# Patient Record
Sex: Male | Born: 1955 | Race: White | Hispanic: No | Marital: Married
Health system: Midwestern US, Community
[De-identification: ages and names within clinical notes are randomized; demographics above are authoritative.]

## PROBLEM LIST (undated history)

## (undated) DIAGNOSIS — M0579 Rheumatoid arthritis with rheumatoid factor of multiple sites without organ or systems involvement: Principal | ICD-10-CM

## (undated) DIAGNOSIS — M069 Rheumatoid arthritis, unspecified: Secondary | ICD-10-CM

## (undated) DIAGNOSIS — R5383 Other fatigue: Secondary | ICD-10-CM

## (undated) DIAGNOSIS — Z79631 Long term (current) use of antimetabolite agent: Secondary | ICD-10-CM

## (undated) DIAGNOSIS — D649 Anemia, unspecified: Secondary | ICD-10-CM

## (undated) DIAGNOSIS — R5381 Other malaise: Secondary | ICD-10-CM

---

## 2015-09-25 NOTE — Telephone Encounter (Addendum)
Call Type    VM   PRIORITY Normal   Initial Call Started: Richard Pitts,  Sep 19, 2015 10:22 AM   Call For: Richard Brunner MD   Time of Voicemail 10:10   Date of Voicemail 09/19/2015   VM Message:   needs standing lab orders renewed, he  uses Me. [facility], if any questions, patient can be reached at # [phone]      Initial call taken by: Richard Pitts,  Sep 19, 2015 10:22 AM         Follow-up for Phone Call    Follow-up Details: Standing  orders entered & will get MD hand signature; then I will fax.   Follow-up by: Annamary Carolin, LPN-II,  Sep 20, 5007 10:33 AM         New Orders:   CMP [[id number]]   CBC Reflex [[id number]]   ESR [[id number]]   C-Reactive Protein (CRP) [[id number]]         Electronically signed by Annamary Carolin, LPN-II on 69/67/8938 at 10:37 AM   Electronically signed by Richard Brunner MD on 09/20/2015 at 4:30 PM   ________________________________________________________________________

## 2015-10-02 NOTE — Telephone Encounter (Addendum)
Call Type    VM   PRIORITY Normal   Initial Call Started: Margarita RanaJani Shuman,  September 29, 2015 4:03 PM   Call For: Vangie BickerJalal Baer Hinton MD   Call back at Home Phone [phone]   Time of Voicemail 4:01   Date of Voicemail 09/29/2015   VM Message:   patient has done his labs done and would [facility], if any questions he can be reached at [phone]   Initial call taken by: Margarita RanaJani Shuman,  September 29, 2015 4:04 PM         Follow-up for Phone Call    Follow-up Details: labs stable on 09/27/15- refill  done      Follow-up by: Lyda PeroneElaine Hill, LPN-II,  September 29, 2015 4:14 PM   Prescriptions:   METHOTREXATE 2.5 MG TABS (METHOTREXATE SODIUM) Take eight (8) by mouth once weekly  #96[Unspecified] x 0       Entered by: Lyda PeroneElaine Hill, LPN-II       Authorized by:  Vangie BickerJalal Mohit Zirbes MD       Electronically signed by:   Lyda PeroneElaine Hill, LPN-II on 47/82/956206/12/2015       Method used:    Electronically to                [facility] (mail[facility]order)                              ,                  Ph:                Fax: [phone]       Rx:   [id number]:   [id number]                  Electronically signed by Lyda PeroneElaine Hill, LPN-II on 27/25/366406/12/2015 at 4:15 PM   Electronically signed by Vangie BickerJalal Renise Gillies MD on 10/02/2015 at 7:51 AM   ________________________________________________________________________

## 2016-04-05 NOTE — Progress Notes (Signed)
Visit Type:  Follow-up   Primary Provider:  Carollee Herter L. Lyda Jester FNP         History of Present Illness:    Richard Pitts returns for follow-up.      He is followed in rheumatology for seropositive rheumatoid arthritis.  He is on methotrexate 20 mg weekly, daily folic acid.      I have reviewed his chart since last visit, labs reviewed.  Interval history obtained.      Tyjae reports that he has done very well from rheumatoid arthritis perspective since last visit.  He denies any swollen or painful joints.  He reports some days he will have some stiffness in knuckles but most of the time he is symptom free.      He's been tolerating methotrexate well.  I reviewed his most recent blood work with him.  AST 12, ALT 34, creatinine 1.28, white cell count 7.7, hemoglobin 13, platelets 285.      Otherwise, patient denies any headaches, vision changes, rashes, photosensitivity, oral ulcers, genital ulcers, pleurisy, chest pain, cough, shortness of breath, abdominal pain, bloody stools, changes in bowel or urinary habits, new numbness tingling or weakness.   Review of other systems is negative except as mentioned above.      Social history and medications were reviewed.      RAPID 3 Scoring       Questions 11-13 have been found to be informative, but are not scored formally.       Over the last week were you able to:      Dress yourself, including tying shoelaces and doing buttons? 0   Get in and out of bed? 0   Lift a cup or glass to your mouth? 0   Walk outdoors on flat ground? 0   Wash and dry your entire body? 0   Bend down to pick up clothing from the floor? 0   Turn regular faucets on and off? 0   Get in and out of a car, bus, train or airplane? 0   Walk two miles or 3 kilometers, if you wish? 0   Participate in recreational activities and sports as you would like, if you wish? 0   Get a good night's sleep? 0   Deal with feelings of anxiety or being nervous? 0   Deal with feelings of depression or feeling blue?  0       Subtotal:  0      How much pain have you had because of your condition OVER THE PAST WEEK on a scale of 1-10 ?      0   Previous Pain Score:  0      Considering all the ways in which illness and health conditions may affect you at this time, please indicate below how you are doing (on a scale of 1-10):     0      Cumulative Score:  0   Weighed RAPID 3 Score 1   Previous RAPID3 Score:  1 (07/27/2015 4:24:41 PM)                  I reviewed and addressed the following rheumatology standards today:   VWU:[id number] L. Curtis FNP   Rapid 3:1 on 02/15/2016.   Pain Score:0 on 02/15/2016.      Rheumatology History:  Diagnosed rheumatoid arthritis based on positive rheumatoid factor, polyarthritis.   Negative anti-CCP antibody.   Infection screen: Negative hepatitis panel.   X-ray hands 2016: No erosions.  Normal x-rays.  S   3/16: Started on methotrexate, folic acid.   8/16:80% improvement with methotrexate, but still has some pain and achiness.  Want to give methotrexate full 6 months before adding hydroxychloroquine.   10/17: Rheumatoid arthritis in excellent remission.         Past Medical History:      Reviewed history from 07/11/2014 and no changes required:         see problem list      Past Surgical History:      Reviewed history from 07/11/2014 and no changes required:         unremarkable      Family History Summary:       Reviewed history Last on 07/27/2015 and no changes required:02/15/2016         General Comments - FH:   Mother: decd, kidney failure   Father: living, dementia   Sister: 2, living MS, lung    Brother: 1 alive and well   Maternal Grandmother: decd, age   Maternal Grandfather: decd, Cancer   Paternal Grandmother: decd, dementia and age arthritis   Paternal Grandfather:  decd, suicide   Children: 2 sons, and 1 daughter               Social History:      Reviewed history from 07/11/2014 and no changes required:         married         [employer]          Passive smoke exposure - no          Alcohol Use - yes         Drug Use - no         HIV High Risk Behavior - no         Patient does not get regular exercise.                   Smoking History:         Patient is a former smoker.         Depression Screening    Over the last 2 weeks has often been bothered by feeling down, depressed or hopeless:  Yes   Over the last 2 weeks has often been bothered by little interest or pleasure in doing things:  Yes   Depression Management Plan Required       Tobacco Use    Former smoker   Comments: quit 30 years ago      Do you use other tobacco products?  no   Have you been or are you currently exposed to second hand smoke?  no      For men, in the past year how often have you had 5 or more drinks a day? never   In the past year, how often have you used prescription drugs for non medical reasons? (example: getting high) never   In the past year, how often have you used illegal drugs?  never      Exercise    How intense is your typical exercise?  I am currently not exercising      Nutrition    How would you describe your eating habits? healthy   Do you have any concerns about what you eat? no I do not have concerns   Is there anything you would like to change? no   Caffeine use (drinks/day) 1/2 cup coffee  Review of Systems         See HPI         Vital Signs    Transition of Care:  Specialist   Patient Identity Verified with 2 Methods  Yes      Pain Assessment    Are you having significant pain today that you would like to discuss with the provider you are seeing today?no   BP Location:  Left arm   BP Cuff Size:  Regular   Method:  Automatic      Final BP: 125 / 73   Blood Pressure: 125/73 mm Hg   Pulse #1 53   Height: 75 inches    Entered Weight 237.2 lb Calculated Weight: 237.20 lb.  107.82kg   Body Mass Index: 29.76      Body Surface Area (m2): 2.36      Documented By: Cathleen Fears CMA,AAMA (February 15, 2016 4:15 PM)      Medications were reviewed and reconciled with the patient during this  visit including over the counter medications.       Allergies were reviewed with the patient during this visit.         Active Problems:     Renal insufficiency (ICD-593.9) (ICD10-N28.9)   MALAISE AND FATIGUE (ICD-780.79) (ICD10-R53.81)   Long-term (current) use of other medications (ICD-V58.69) (ICD10-Z79.899)   RHEUMATOID ARTHRITIS (ICD-714.0) (ICD10-M06.9)   Afib (ICD-427.31) (ICD10-I48.91)   Hypothyroidism (ICD-244.9) (ICD10-E03.9)      Active Medications:    METHOTREXATE 2.5 MG TABS (METHOTREXATE SODIUM) Take eight (8) by mouth once weekly   FOLIC ACID 1 MG TABS (FOLIC ACID) Take one (1) by mouth daily   VITAMIN D 1000 UNIT TABS (CHOLECALCIFEROL) 2 every day by mouth   ASPIRIN 325 MG TABS (ASPIRIN) 1 every day by mouth   SYNTHROID 25 MCG TABS (LEVOTHYROXINE SODIUM) 1 every day by mouth --not sure of the dose   METOPROLOL TARTRATE 25 MG TABS (METOPROLOL TARTRATE) one twice a day by mouth               Physical Exam      General:       well developed, well nourished, in no acute distress   Eyes:       PERRLA/EOM intact; conjunctiva and sclera clear   Mouth:       no deformity or mucous membrane lesions   Lungs:       clear bilaterally to A & P   Heart:       regular rate and rhythm, S1, S2 without murmurs, rubs, gallops, or clicks   Abdomen:       No pain, distention, tenderness.   Bowel sounds present.   No organomegaly.      Msk:       No active synovitis, tenderness, effusion, limitation in range of motion of DIPs, PIPs, MCPs, wrists, elbows, knees, ankles, toes bilaterally.   Normal range of motion of bilateral hips and shoulders.      Neurologic:       No gross deficit.   Normal speech, gait, coordination               Impression & Recommendations:      Problem # 1:  RHEUMATOID ARTHRITIS (ICD-714.0) (ICD10-M06.9)   RA is in an excellent clinical remission.   Continue methotrexate 20 mg weekly, daily folic acid.   Orders:   Ofc Vst, Est Lvl 3 ([id number])  Problem # 2:  Long-term (current) use of other medications (ICD-V58.69) (ICD10-Z79.899)   He is tolerating methotrexate well.  Blood work is stable.  Okay to continue methotrexate with regular blood work every 8&12 weeks.      Follow-up with me in 6 months.   Orders:   Ofc Vst, Est Lvl 3 ([id number])            Patient Instructions:   1)  Please schedule a follow-up appointment in 6 months.      Prescriptions:   FOLIC ACID 1 MG TABS (FOLIC ACID) Take one (1) by mouth daily  #90[Tablet] x 3       Entered by: Meriel PicaMark Slauenwhite CMA,AAMA       Authorized by:  Vangie BickerJalal Abelina Ketron MD       Electronically signed by:   Cathleen FearsMark Slauenwhite [facility] Hospital[facility]MA,AAMA on 02/15/2016       Method used:    Electronically to                [facility] (mail[facility]order)                              ,                  Ph:                Fax: [phone]       Rx:   [id number]:   [id number]   METHOTREXATE 2.5 MG TABS (METHOTREXATE SODIUM) Take eight (8) by mouth once weekly  #96[Tablet] x 0       Entered by: Meriel PicaMark Slauenwhite CMA,AAMA       Authorized by:  Vangie BickerJalal Burrell Hodapp MD       Electronically signed by:   Cathleen FearsMark Slauenwhite [facility] Center[facility]MA,AAMA on 02/15/2016       Method used:    Electronically to                [facility]* (mail-order)                              ,                  Ph:                Fax: [phone]       Rx:   [id number]:   [id number]               Orders:   Added new Service order of Ofc Vst, Est Lvl 3 ([id number]) - Signed            I have either personally documented or reviewed the patients past medical, surgical, family and social history and review of systems if present in the note.          Electronically signed by Vangie BickerJalal Hesham Womac MD on 02/15/2016 at 4:38 PM   ________________________________________________________________________   faxed to PCP               Electronically signed by Merrilyn PumaAlison Davis on 02/16/2016 at 12:28 PM   ________________________________________________________________________

## 2016-07-16 ENCOUNTER — Telehealth

## 2016-07-16 NOTE — Telephone Encounter (Signed)
Richard Pitts,    There is medication refill request for methotrexate.  His last blood work was in October  He needs to do blood work before I refill methotrexate.  I have ordered a standing order.  Please let patient know about this.  Once labs are stable I can refill medications.  Thanks

## 2016-07-16 NOTE — Telephone Encounter (Signed)
I left patient a message about this and mailed him the lab orders. & that no refills until labs are done & stable. Lab orders printed to be mailed to him.  I also [facility] the lab [facility] [phone]

## 2016-08-07 ENCOUNTER — Encounter

## 2016-08-08 ENCOUNTER — Telehealth

## 2016-08-08 ENCOUNTER — Encounter

## 2016-08-08 MED ORDER — METHOTREXATE SODIUM 2.5 MG TAB
2.5 mg | ORAL_TABLET | ORAL | 1 refills | Status: DC
Start: 2016-08-08 — End: 2017-02-09

## 2016-08-08 NOTE — Telephone Encounter (Signed)
I called  & left him a message; from [facility]; [facility] had [facility]; I let him know that is where I will send that RX. And I will mail him lab orders for every 3 months.   lab orders entered in " Orders only encounter"

## 2016-08-08 NOTE — Telephone Encounter (Signed)
His blood work is stable.    We can refill methotrexate 20 mg weekly.    But I do not know his pharmacy.  [facility] is [facility].  But please give him a call and find out if we need to send it to a local pharmacy.    He should continue doing blood work every 3 months.  Thanks

## 2016-08-15 ENCOUNTER — Ambulatory Visit: Admit: 2016-08-15 | Discharge: 2016-08-15 | Payer: TRICARE (CHAMPUS) | Attending: Internal Medicine

## 2016-08-15 DIAGNOSIS — M0579 Rheumatoid arthritis with rheumatoid factor of multiple sites without organ or systems involvement: Secondary | ICD-10-CM

## 2016-08-15 NOTE — Progress Notes (Signed)
Rheumatology History and Physical    Subjective:      Richard Pitts returns.  He is followed in rheumatology for seropositive rheumatoid arthritis.  He is on methotrexate 20 mg weekly, daily folic acid.     I have reviewed his chart, lab results.  Interval history obtained since I last saw him.    Richard Pitts it reports that he has done well from arthritis perspective.  He reports that on cold and rainy days he will have some stiffness in his knuckles but mostly he does not have much in terms of pain or stiffness.    He is tolerating methotrexate well.  He denies any side effects from it.  His most recent blood work is stable with normal sed rate of 6.  White cell count 7.21, hemoglobin 13.6, platelets 236, AST 11, ALT 31, creatinine 1.21.        Review of Systems   Constitutional: Negative.    HENT: Negative.    Eyes: Negative.    Respiratory: Negative.    Cardiovascular: Negative.    Gastrointestinal: Negative.    Genitourinary: Negative.    Musculoskeletal: Negative.    Skin: Negative.    Neurological: Negative.    Endo/Heme/Allergies: Negative.    Psychiatric/Behavioral: Negative.        Disease History:  Rheumatology History:  Diagnosed rheumatoid arthritis based on positive rheumatoid factor, polyarthritis.   Negative anti-CCP antibody.   Infection screen: Negative hepatitis panel.   X-ray hands 2016: No erosions.  Normal x-rays.     3/16: Started on methotrexate, folic acid.   8/16:80% improvement with methotrexate, but still has some pain and achiness.  Want to give methotrexate full 6 months before adding hydroxychloroquine.   10/17: Rheumatoid arthritis in excellent remission.       Past Medical History:   Diagnosis Date   ??? Afib (HCC) 07/11/2014   ??? Hypothyroidism 07/11/2014   ??? Malaise and fatigue 07/11/2014   ??? Other long term (current) drug therapy 07/11/2014   ??? Renal insufficiency syndrome 07/13/2014   ??? Rheumatoid arthritis (HCC) 07/11/2014     History reviewed. No pertinent surgical history.  Family History    Problem Relation Age of Onset   ??? Kidney Disease Mother      KIDNEY FAILURE   ??? Dementia Father    ??? MS Sister    ??? Lung Disease Sister    ??? No Known Problems Brother    ??? Cancer Maternal Grandfather    ??? Dementia Paternal Grandmother    ??? Arthritis-osteo Paternal Grandmother    ??? MS Sister    ??? Lung Disease Sister    ??? No Known Problems Son    ??? No Known Problems Son    ??? No Known Problems Daughter      Social History   Substance Use Topics   ??? Smoking status: Former Smoker   ??? Smokeless tobacco: Never Used   ??? Alcohol use Yes     Allergies no known allergies  Prior to Admission medications    Medication Sig Start Date End Date Taking? Authorizing Provider   pedi multivit no.140-iron fum (CHILD MULTIVITAMIN PLUS IRON) 18 mg iron chew Take  by mouth.   Yes Historical Provider   cholecalciferol (VITAMIN D3) 1,000 unit tablet Take 2 Tabs by mouth daily. 07/11/14  Yes Historical Provider   aspirin (ASPIRIN) 325 mg tablet Take 1 Tab by mouth daily. 07/11/14  Yes Historical Provider   folic acid (FOLVITE) 1 mg tablet  Take 1 Tab by mouth daily. 07/12/14  Yes Historical Provider   metoprolol tartrate (LOPRESSOR) 25 mg tablet Take 1 Tab by mouth two (2) times a day. 07/11/14  Yes Historical Provider   levothyroxine (SYNTHROID) 25 mcg tablet Take 1 Tab by mouth daily. 07/11/14  Yes Historical Provider   methotrexate (RHEUMATREX) 2.5 mg tablet Take 8 Tabs by mouth Every Friday. 08/09/16  Yes Vangie Bicker, MD   aspirin (ASPIRIN) 325 mg tablet Take 325 mg by mouth daily.   Yes Historical Provider              Lab Review:  Reviewed per HPI    Vitals:    08/15/16 1630   BP: 135/68   Pulse: (!) 51   Weight: 234 lb (106.1 kg)   Height:  (1.905 m)       Physical Exam   Constitutional: He is oriented to person, place, and time and well-developed, well-nourished, and in no distress.   HENT:   Head: Normocephalic and atraumatic.   Right Ear: External ear normal.   Left Ear: External ear normal.    Eyes: Conjunctivae and EOM are normal. Pupils are equal, round, and reactive to light.   Neck: Normal range of motion. Neck supple. No thyromegaly present.   Cardiovascular: Normal rate, regular rhythm and normal heart sounds.    Pulmonary/Chest: Effort normal and breath sounds normal. He has no wheezes. He has no rales.   Abdominal: Soft. Bowel sounds are normal. He exhibits no distension and no mass. There is no tenderness.   Musculoskeletal:   No tenderness, effusion, warmth, redness, synovitis in the DIPs, PIPs, MCPs, wrists elbows, knees, ankles and toes bilaterally.    Normal range of motion of both shoulders and hips.     Neurological: He is alert and oriented to person, place, and time.   Skin: Skin is warm and dry.   Psychiatric: Affect and judgment normal.       Plan:     Encounter Diagnoses     ICD-10-CM ICD-9-CM   1. Rheumatoid arthritis involving multiple sites with positive rheumatoid factor (HCC)  Rheumatoid arthritis is well controlled.  Continue 20 mg weekly methotrexate, daily folic acid. M05.79 714.0   2. Long term methotrexate user  He is tolerating methotrexate well.  Most recent blood work is stable.  Okay to continue methotrexate with regular blood work every 12 weeks. Z61.096 V58.69       Signed By:  Vangie Bicker, MD     August 15, 2016

## 2016-08-15 NOTE — Patient Instructions (Signed)
Rheumatoid arthritis is well controlled.  Please continue current dosages of methotrexate and folic acid.  Blood work every 3 months.  Follow up with me in 6 months.

## 2016-08-28 NOTE — Telephone Encounter (Signed)
Pt called in regards to the letter he got from this office stating he is missing lab work.  He got [facility] done and [facility] and that his PCP: Dr. Tonita PhoenixNightingale also had the results

## 2016-08-29 NOTE — Telephone Encounter (Signed)
I will put on providers desk to review

## 2016-08-29 NOTE — Telephone Encounter (Signed)
They are faxing over lab results from April

## 2016-09-19 ENCOUNTER — Encounter

## 2016-11-25 ENCOUNTER — Encounter

## 2016-12-04 ENCOUNTER — Encounter

## 2016-12-06 ENCOUNTER — Encounter

## 2017-02-09 ENCOUNTER — Encounter

## 2017-02-12 MED ORDER — METHOTREXATE SODIUM 2.5 MG TAB
2.5 mg | ORAL_TABLET | ORAL | 1 refills | Status: DC
Start: 2017-02-12 — End: 2017-04-11

## 2017-02-18 ENCOUNTER — Encounter: Attending: Internal Medicine

## 2017-02-20 ENCOUNTER — Ambulatory Visit: Admit: 2017-02-20 | Discharge: 2017-02-20 | Payer: TRICARE (CHAMPUS) | Attending: Internal Medicine

## 2017-02-20 DIAGNOSIS — M0579 Rheumatoid arthritis with rheumatoid factor of multiple sites without organ or systems involvement: Secondary | ICD-10-CM

## 2017-02-20 NOTE — Progress Notes (Signed)
Rheumatology History and Physical    Subjective:      Richard Pitts   He is followed in rheumatology for seropositive rheumatoid arthritis. ??He is on methotrexate 20 mg weekly, daily folic acid.     I have reviewed his chart, lab results.  Interval history obtained since I last saw him.    Richard GristDave it reports that he is doing very good.  He does not report any pain, stiffness.  He is tolerating methotrexate well.  I reviewed his most recent blood work from this morning.  It is stable with sed rate of 8, creatinine 1.1, AST 14, ALT 46, white cell count 6, hemoglobin 14, platelets 253.    Review of Systems   Constitutional: Negative.    HENT: Negative.    Eyes: Negative.    Respiratory: Negative.    Cardiovascular: Negative.    Gastrointestinal: Negative.    Genitourinary: Negative.    Musculoskeletal: Negative.    Skin: Negative.    Neurological: Negative.    Endo/Heme/Allergies: Negative.    Psychiatric/Behavioral: Negative.        Disease History:  Rheumatology History: ??Diagnosed rheumatoid arthritis based on positive rheumatoid factor, polyarthritis.   Negative anti-CCP antibody.   Infection screen: Negative hepatitis panel.   X-ray hands 2016: No erosions. ??Normal x-rays. ??   3/16: Started on methotrexate, folic acid.   8/16:80% improvement with methotrexate, but still has some pain and achiness. ??Want to give methotrexate full 6 months before adding hydroxychloroquine.   10/17: Rheumatoid arthritis in excellent remission.           Past Medical History:   Diagnosis Date   ??? Afib (HCC) 07/11/2014   ??? Hypothyroidism 07/11/2014   ??? Malaise and fatigue 07/11/2014   ??? Other long term (current) drug therapy 07/11/2014   ??? Renal insufficiency syndrome 07/13/2014   ??? Rheumatoid arthritis (HCC) 07/11/2014     History reviewed. No pertinent surgical history.  Family History   Problem Relation Age of Onset   ??? Kidney Disease Mother         KIDNEY FAILURE   ??? Dementia Father    ??? MS Sister    ??? Lung Disease Sister     ??? No Known Problems Brother    ??? Cancer Maternal Grandfather    ??? Dementia Paternal Grandmother    ??? Arthritis-osteo Paternal Grandmother    ??? MS Sister    ??? Lung Disease Sister    ??? No Known Problems Son    ??? No Known Problems Son    ??? No Known Problems Daughter      Social History     Tobacco Use   ??? Smoking status: Former Smoker   ??? Smokeless tobacco: Never Used   Substance Use Topics   ??? Alcohol use: Yes     No Known Allergies  Prior to Admission medications    Medication Sig Start Date End Date Taking? Authorizing Provider   levothyroxine (SYNTHROID) 75 mcg tablet  11/21/16  Yes Provider, Historical   methotrexate (RHEUMATREX) 2.5 mg tablet TAKE 8 TABLETS EVERY FRIDAY 02/12/17  Yes Anjel Perfetti, MD   pedi multivit no.140-iron fum (CHILD MULTIVITAMIN PLUS IRON) 18 mg iron chew Take  by mouth.   Yes Provider, Historical   cholecalciferol (VITAMIN D3) 1,000 unit tablet Take 2 Tabs by mouth daily. 07/11/14  Yes Provider, Historical   folic acid (FOLVITE) 1 mg tablet Take 1 Tab by mouth daily. 07/12/14  Yes Provider, Historical  metoprolol tartrate (LOPRESSOR) 25 mg tablet Take 1 Tab by mouth two (2) times a day. 07/11/14  Yes Provider, Historical   aspirin (ASPIRIN) 325 mg tablet Take 325 mg by mouth daily.   Yes Provider, Historical   cyclobenzaprine (FLEXERIL) 5 mg tablet  01/31/17   Provider, Historical              Lab Review:  Reviewed per HPI    Vitals:    02/20/17 1554   BP: 124/78   Pulse: (!) 49   Weight: 231 lb (104.8 kg)       Physical Exam   Constitutional: He is oriented to person, place, and time and well-developed, well-nourished, and in no distress.   HENT:   Head: Normocephalic and atraumatic.   Right Ear: External ear normal.   Left Ear: External ear normal.   Eyes: Conjunctivae and EOM are normal. Pupils are equal, round, and reactive to light.   Neck: Normal range of motion. Neck supple. No thyromegaly present.   Cardiovascular: Normal rate, regular rhythm and normal heart sounds.    Pulmonary/Chest: Effort normal and breath sounds normal. He has no wheezes. He has no rales.   Abdominal: Soft. Bowel sounds are normal. He exhibits no distension and no mass. There is no tenderness.   Musculoskeletal:   No tenderness, effusion, warmth, redness, synovitis in the DIPs, PIPs, MCPs, wrists elbows, knees, ankles and toes bilaterally.    Normal range of motion of both shoulders and hips.     Neurological: He is alert and oriented to person, place, and time.   Skin: Skin is warm and dry.   Psychiatric: Affect and judgment normal.       Plan:     Encounter Diagnoses     ICD-10-CM ICD-9-CM   1. Rheumatoid arthritis involving multiple sites with positive rheumatoid factor (HCC)  Rheumatoid arthritis is well controlled.  Continue methotrexate 20 mg weekly, daily folic acid. M05.79 714.0   2. Long term methotrexate user  Most recent blood work reviewed with him.  It is stable.  He will continue doing blood work every 12 weeks to monitor for cytopenias, transaminitis.    Follow up with me in 1 year.  Earlier if needed. Z61.096 V58.69       Signed By:  Vangie Bicker, MD     February 20, 2017

## 2017-02-20 NOTE — Patient Instructions (Signed)
Follow-up in 1 year.

## 2017-04-10 MED ORDER — FOLIC ACID 1 MG TAB
1 mg | ORAL_TABLET | ORAL | 3 refills | Status: DC
Start: 2017-04-10 — End: 2018-03-13

## 2017-04-11 ENCOUNTER — Encounter

## 2017-04-11 MED ORDER — METHOTREXATE SODIUM 2.5 MG TAB
2.5 mg | ORAL_TABLET | ORAL | 1 refills | Status: DC
Start: 2017-04-11 — End: 2017-07-15

## 2017-04-17 ENCOUNTER — Telehealth

## 2017-04-17 NOTE — Telephone Encounter (Addendum)
Ok to refill methotrexate.  He e-mail me for that.  Up-to-date on labs.   [facility]    Please ask him to make sure we send it to the correct 1.    Also let him know that he needs to do hepatitis-B core antibody.     Please mail him the orders.

## 2017-04-17 NOTE — Telephone Encounter (Signed)
Regarding: FW: Prescription Question  Contact: [phone]      ----- Message -----  From: Richard Pitts  Sent: 04/11/2017   3:14 PM  To: Cipriano BunkerBrh Ma  Subject: Prescription Question                            ----- Message from Mychart, Generic sent at 04/11/2017  3:14 PM EST -----    Dr. Rica MastMukhtar,    I just submitted a refill request for methotrexate. It might be early, but want to make sure it is processed and I receive it before January 24th because my wife and I are headed to [place] again for three months on January 25th. Just want to make sure I have enough to cover the next 4 months.    Thanks and Happy Holidays to you, your staff, and especially your family.    Richard Pitts

## 2017-04-17 NOTE — Telephone Encounter (Signed)
Regarding: FW: Prescription Question  Contact: [phone]      ----- Message -----  From: Richard Pitts  Sent: 04/11/2017   3:14 PM  To: Brh Ma  Subject: Prescription Question                            ----- Message from Mychart, Generic sent at 04/11/2017  3:14 PM EST -----    Dr. Maricela Schreur,    I just submitted a refill request for methotrexate. It might be early, but want to make sure it is processed and I receive it before January 24th because my wife and I are headed to [place] again for three months on January 25th. Just want to make sure I have enough to cover the next 4 months.    Thanks and Happy Holidays to you, your staff, and especially your family.    Richard Pitts

## 2017-04-18 NOTE — Telephone Encounter (Signed)
Order mailed

## 2017-07-15 ENCOUNTER — Encounter

## 2017-07-17 MED ORDER — METHOTREXATE SODIUM 2.5 MG TAB
2.5 mg | ORAL_TABLET | ORAL | 1 refills | Status: DC
Start: 2017-07-17 — End: 2018-01-14

## 2017-07-18 ENCOUNTER — Encounter

## 2017-11-20 ENCOUNTER — Encounter

## 2018-01-14 ENCOUNTER — Encounter

## 2018-01-14 MED ORDER — METHOTREXATE SODIUM 2.5 MG TAB
2.5 mg | ORAL_TABLET | ORAL | 1 refills | Status: DC
Start: 2018-01-14 — End: 2018-02-17

## 2018-01-14 NOTE — Telephone Encounter (Signed)
Follow up 02/24/18  Last labs 01/02/18    90 day rx with 1 refill sent.

## 2018-02-17 ENCOUNTER — Ambulatory Visit

## 2018-02-17 ENCOUNTER — Ambulatory Visit: Admit: 2018-02-17 | Discharge: 2018-02-17 | Payer: TRICARE (CHAMPUS) | Attending: Internal Medicine

## 2018-02-17 DIAGNOSIS — M0579 Rheumatoid arthritis with rheumatoid factor of multiple sites without organ or systems involvement: Secondary | ICD-10-CM

## 2018-02-17 MED ORDER — METHOTREXATE SODIUM 2.5 MG TAB
2.5 mg | ORAL_TABLET | ORAL | 1 refills | Status: DC
Start: 2018-02-17 — End: 2018-11-18

## 2018-02-17 NOTE — Progress Notes (Signed)
Rheumatology History and Physical    Subjective:      Richard Pitts returns.  He is followed in rheumatology for seropositive rheumatoid arthritis. ??He is on methotrexate 20 mg weekly, daily folic acid.??    I have reviewed his chart, interval history obtained.  Labs reviewed.    Richard Pitts reports that he is doing great.  He reports he does not have any painful, swollen joints.  No stiffness.  He is tolerating methotrexate well.  Most recent blood work from September shows normal sed rate and CRP of 13, 0.3 respectively.  White cell count 6.5, hemoglobin 14, platelets 261, AST 9, ALT 26, creatinine 1.22, AST 9, ALT 26.    He reports he recently had an elevated PSA.  He is going to [place] but he has discussed with his doctors.  He will do biopsy after he returns.  Today he denies any urinary symptoms.        Review of Systems   Constitutional: Negative.    HENT: Negative.    Eyes: Negative.    Respiratory: Negative.    Cardiovascular: Negative.    Gastrointestinal: Negative.    Genitourinary: Negative.    Musculoskeletal: Negative.    Skin: Negative.    Neurological: Negative.    Endo/Heme/Allergies: Negative.    Psychiatric/Behavioral: Negative.        Disease History:  Rheumatology History: ??Diagnosed rheumatoid arthritis based on positive rheumatoid factor, polyarthritis.   Negative anti-CCP antibody.   Infection screen: Negative chronic B/C hepatitis panel.   X-ray hands 2016: No erosions. ??Normal x-rays. ????  3/16: Started on methotrexate, folic acid.   8/16:80% improvement with methotrexate, but still has some pain and achiness. ??Want to give methotrexate full 6 months before adding hydroxychloroquine.   10/17: Rheumatoid arthritis in excellent remission.??  ??      Past Medical History:   Diagnosis Date   ??? Afib (HCC) 07/11/2014   ??? Hypothyroidism 07/11/2014   ??? Malaise and fatigue 07/11/2014   ??? Other long term (current) drug therapy 07/11/2014   ??? Renal insufficiency syndrome 07/13/2014    ??? Rheumatoid arthritis (HCC) 07/11/2014     History reviewed. No pertinent surgical history.  Family History   Problem Relation Age of Onset   ??? Kidney Disease Mother         KIDNEY FAILURE   ??? Dementia Father    ??? MS Sister    ??? Lung Disease Sister    ??? No Known Problems Brother    ??? Cancer Maternal Grandfather    ??? Dementia Paternal Grandmother    ??? Arthritis-osteo Paternal Grandmother    ??? MS Sister    ??? Lung Disease Sister    ??? No Known Problems Son    ??? No Known Problems Son    ??? No Known Problems Daughter      Social History     Tobacco Use   ??? Smoking status: Former Smoker   ??? Smokeless tobacco: Never Used   Substance Use Topics   ??? Alcohol use: Yes     No Known Allergies  Prior to Admission medications    Medication Sig Start Date End Date Taking? Authorizing Provider   methotrexate (RHEUMATREX) 2.5 mg tablet Take 8 Tabs by mouth Every Friday. 02/20/18  Yes Aarini Slee, Bevelyn Ngo, MD   folic acid (FOLVITE) 1 mg tablet TAKE 1 TABLET DAILY 04/10/17   Vangie Bicker, MD   cyclobenzaprine (FLEXERIL) 5 mg tablet  01/31/17   Provider, Historical  levothyroxine (SYNTHROID) 75 mcg tablet  11/21/16   Provider, Historical   pedi multivit no.140-iron fum (CHILD MULTIVITAMIN PLUS IRON) 18 mg iron chew Take  by mouth.    Provider, Historical   cholecalciferol (VITAMIN D3) 1,000 unit tablet Take 2 Tabs by mouth daily. 07/11/14   Provider, Historical   metoprolol tartrate (LOPRESSOR) 25 mg tablet Take 1 Tab by mouth two (2) times a day. 07/11/14   Provider, Historical   aspirin (ASPIRIN) 325 mg tablet Take 325 mg by mouth daily.    Provider, Historical              Lab Review:  Reviewed per HPI    Vitals:    02/17/18 1058   BP: 144/79   Pulse: (!) 49   Weight: 234 lb (106.1 kg)   Height: 6\' 3"  (1.905 m)       Physical Exam   Constitutional: He is oriented to person, place, and time and well-developed, well-nourished, and in no distress.   HENT:   Head: Normocephalic and atraumatic.   Right Ear: External ear normal.    Left Ear: External ear normal.   Eyes: Pupils are equal, round, and reactive to light. Conjunctivae and EOM are normal.   Neck: Normal range of motion. Neck supple. No thyromegaly present.   Cardiovascular: Normal rate, regular rhythm and normal heart sounds.   Pulmonary/Chest: Effort normal and breath sounds normal. He has no wheezes. He has no rales.   Abdominal: Soft. Bowel sounds are normal. He exhibits no distension and no mass. There is no tenderness.   Musculoskeletal:   No tenderness, effusion, warmth, redness, synovitis in the DIPs, PIPs, MCPs, wrists elbows, knees, ankles and toes bilaterally.    Normal range of motion of both shoulders and hips.     Neurological: He is alert and oriented to person, place, and time.   Skin: Skin is warm and dry.   Psychiatric: Affect and judgment normal.       Plan:     Encounter Diagnoses     ICD-10-CM ICD-9-CM   1. Rheumatoid arthritis involving multiple sites with positive rheumatoid factor (HCC) M05.79 714.0   2. Long term methotrexate user Z79.899 V58.69   3. Rheumatoid arthritis, involving unspecified site, unspecified rheumatoid factor presence (HCC) M06.9 714.0    Rheumatoid arthritis is in remission.  Continue methotrexate 20 mg weekly, daily folic acid.  He will continue doing blood work every 12 weeks to monitor for cytopenias and transaminitis.    Follow up in 1 year.    Signed By:  Vangie Bicker, MD     February 17, 2018

## 2018-02-17 NOTE — Progress Notes (Signed)
Rheumatology History and Physical    Subjective:      Richard Pitts returns.  He is followed in rheumatology for seropositive rheumatoid arthritis. ??He is on methotrexate 20 mg weekly, daily folic acid.??    I have reviewed his chart, interval history obtained.  Labs reviewed.    Richard Pitts reports that he is doing great.  He reports he does not have any painful, swollen joints.  No stiffness.  He is tolerating methotrexate well.  Most recent blood work from September shows normal sed rate and CRP of 13, 0.3 respectively.  White cell count 6.5, hemoglobin 14, platelets 261, AST 9, ALT 26, creatinine 1.22, AST 9, ALT 26.    He reports he recently had an elevated PSA.  He is going to [place] but he has discussed with his doctors.  He will do biopsy after he returns.  Today he denies any urinary symptoms.        Review of Systems   Constitutional: Negative.    HENT: Negative.    Eyes: Negative.    Respiratory: Negative.    Cardiovascular: Negative.    Gastrointestinal: Negative.    Genitourinary: Negative.    Musculoskeletal: Negative.    Skin: Negative.    Neurological: Negative.    Endo/Heme/Allergies: Negative.    Psychiatric/Behavioral: Negative.        Disease History:  Rheumatology History: ??Diagnosed rheumatoid arthritis based on positive rheumatoid factor, polyarthritis.   Negative anti-CCP antibody.   Infection screen: Negative chronic B/C hepatitis panel.   X-ray hands 2016: No erosions. ??Normal x-rays. ????  3/16: Started on methotrexate, folic acid.   8/16:80% improvement with methotrexate, but still has some pain and achiness. ??Want to give methotrexate full 6 months before adding hydroxychloroquine.   10/17: Rheumatoid arthritis in excellent remission.??  ??      Past Medical History:   Diagnosis Date   ??? Afib (HCC) 07/11/2014   ??? Hypothyroidism 07/11/2014   ??? Malaise and fatigue 07/11/2014   ??? Other long term (current) drug therapy 07/11/2014   ??? Renal insufficiency syndrome 07/13/2014   ??? Rheumatoid arthritis (HCC)  07/11/2014     History reviewed. No pertinent surgical history.  Family History   Problem Relation Age of Onset   ??? Kidney Disease Mother         KIDNEY FAILURE   ??? Dementia Father    ??? MS Sister    ??? Lung Disease Sister    ??? No Known Problems Brother    ??? Cancer Maternal Grandfather    ??? Dementia Paternal Grandmother    ??? Arthritis-osteo Paternal Grandmother    ??? MS Sister    ??? Lung Disease Sister    ??? No Known Problems Son    ??? No Known Problems Son    ??? No Known Problems Daughter      Social History     Tobacco Use   ??? Smoking status: Former Smoker   ??? Smokeless tobacco: Never Used   Substance Use Topics   ??? Alcohol use: Yes     No Known Allergies  Prior to Admission medications    Medication Sig Start Date End Date Taking? Authorizing Provider   methotrexate (RHEUMATREX) 2.5 mg tablet Take 8 Tabs by mouth Every Friday. 02/20/18  Yes Alvis Pulcini, Bevelyn Ngo, MD   folic acid (FOLVITE) 1 mg tablet TAKE 1 TABLET DAILY 04/10/17   Vangie Bicker, MD   cyclobenzaprine (FLEXERIL) 5 mg tablet  01/31/17   Provider, Historical  levothyroxine (SYNTHROID) 75 mcg tablet  11/21/16   Provider, Historical   pedi multivit no.140-iron fum (CHILD MULTIVITAMIN PLUS IRON) 18 mg iron chew Take  by mouth.    Provider, Historical   cholecalciferol (VITAMIN D3) 1,000 unit tablet Take 2 Tabs by mouth daily. 07/11/14   Provider, Historical   metoprolol tartrate (LOPRESSOR) 25 mg tablet Take 1 Tab by mouth two (2) times a day. 07/11/14   Provider, Historical   aspirin (ASPIRIN) 325 mg tablet Take 325 mg by mouth daily.    Provider, Historical              Lab Review:  Reviewed per HPI    Vitals:    02/17/18 1058   BP: 144/79   Pulse: (!) 49   Weight: 234 lb (106.1 kg)   Height: 6\' 3"  (1.905 m)       Physical Exam   Constitutional: He is oriented to person, place, and time and well-developed, well-nourished, and in no distress.   HENT:   Head: Normocephalic and atraumatic.   Right Ear: External ear normal.   Left Ear: External ear normal.   Eyes: Pupils  are equal, round, and reactive to light. Conjunctivae and EOM are normal.   Neck: Normal range of motion. Neck supple. No thyromegaly present.   Cardiovascular: Normal rate, regular rhythm and normal heart sounds.   Pulmonary/Chest: Effort normal and breath sounds normal. He has no wheezes. He has no rales.   Abdominal: Soft. Bowel sounds are normal. He exhibits no distension and no mass. There is no tenderness.   Musculoskeletal:   No tenderness, effusion, warmth, redness, synovitis in the DIPs, PIPs, MCPs, wrists elbows, knees, ankles and toes bilaterally.    Normal range of motion of both shoulders and hips.     Neurological: He is alert and oriented to person, place, and time.   Skin: Skin is warm and dry.   Psychiatric: Affect and judgment normal.       Plan:     Encounter Diagnoses     ICD-10-CM ICD-9-CM   1. Rheumatoid arthritis involving multiple sites with positive rheumatoid factor (HCC) M05.79 714.0   2. Long term methotrexate user Z79.899 V58.69   3. Rheumatoid arthritis, involving unspecified site, unspecified rheumatoid factor presence (HCC) M06.9 714.0    Rheumatoid arthritis is in remission.  Continue methotrexate 20 mg weekly, daily folic acid.  He will continue doing blood work every 12 weeks to monitor for cytopenias and transaminitis.    Follow up in 1 year.    Signed By:  Vangie Bicker, MD     February 17, 2018

## 2018-02-24 ENCOUNTER — Encounter: Attending: Internal Medicine

## 2018-03-13 MED ORDER — FOLIC ACID 1 MG TAB
1 mg | ORAL_TABLET | ORAL | 4 refills | Status: AC
Start: 2018-03-13 — End: ?

## 2018-03-24 NOTE — Telephone Encounter (Signed)
His most recent blood work shows slightly high creatinine of 1.33 with GFR of 54.  He previously had slightly high creatinine as well and then it normalized.    Please give him a call and make sure that he avoids NSAIDs like ibuprofen, naproxen/Aleve etc.  Tylenol will be okay.  Okay to continue methotrexate with blood work every 12 weeks.  Thanks

## 2018-03-24 NOTE — Telephone Encounter (Signed)
Pt is aware

## 2018-06-17 NOTE — Telephone Encounter (Signed)
Pt left vm asking to transfer care to [facility] in [place]. He would like his records sent there. Left vm for pt explaining he needs to sign a ROI to to release records.

## 2018-06-25 NOTE — Telephone Encounter (Signed)
Lab called from [facility] and they need a new standing order sent to the lab.  Patient had a draw today.      [place] [phone] lab  Fax [phone]

## 2018-06-26 ENCOUNTER — Encounter

## 2018-06-26 NOTE — Telephone Encounter (Signed)
Richard Pitts has faxed the standing order to the lab.  I

## 2018-10-08 ENCOUNTER — Encounter

## 2018-11-18 ENCOUNTER — Telehealth

## 2018-11-18 MED ORDER — METHOTREXATE SODIUM 2.5 MG TAB
2.5 mg | ORAL_TABLET | ORAL | 1 refills | Status: AC
Start: 2018-11-18 — End: ?

## 2018-11-18 NOTE — Telephone Encounter (Signed)
Pt lvm for rx refill    Medication- Methotrexate   Dose- 2.5 mg   How many per day- 8 tabs every Friday    [facility]  How many left- pt is completely out

## 2018-11-18 NOTE — Telephone Encounter (Signed)
I have sent in rx

## 2019-02-18 ENCOUNTER — Encounter: Attending: Internal Medicine

## 2019-04-07 ENCOUNTER — Ambulatory Visit: Payer: TRICARE (CHAMPUS) | Attending: Internal Medicine

## 2019-04-07 NOTE — Telephone Encounter (Signed)
I called Pt to see why they missed there appointment but only got a voice machine. FYI

## 2019-04-08 NOTE — Telephone Encounter (Signed)
PT called left a vm he wanted to know how he missed an appointment. He is going to a new provider and office.

## 2019-04-09 NOTE — Telephone Encounter (Signed)
Kym so he will no longer be seeing [place]?

## 2019-04-09 NOTE — Telephone Encounter (Signed)
Noted  

## 2020-06-27 IMAGING — DX THORACIC SPINE 2 VIEWS
1 series · 2 of 2 positions shown · non-contrast
Comparison: None.

THORACIC SPINE 2 VIEWS, 06/27/2020 [DATE]: 
CLINICAL INDICATION:  Back pain

[Series 1: AP · U · 0.14mm/px · 2 of 2 slices shown]
[im 1/2]
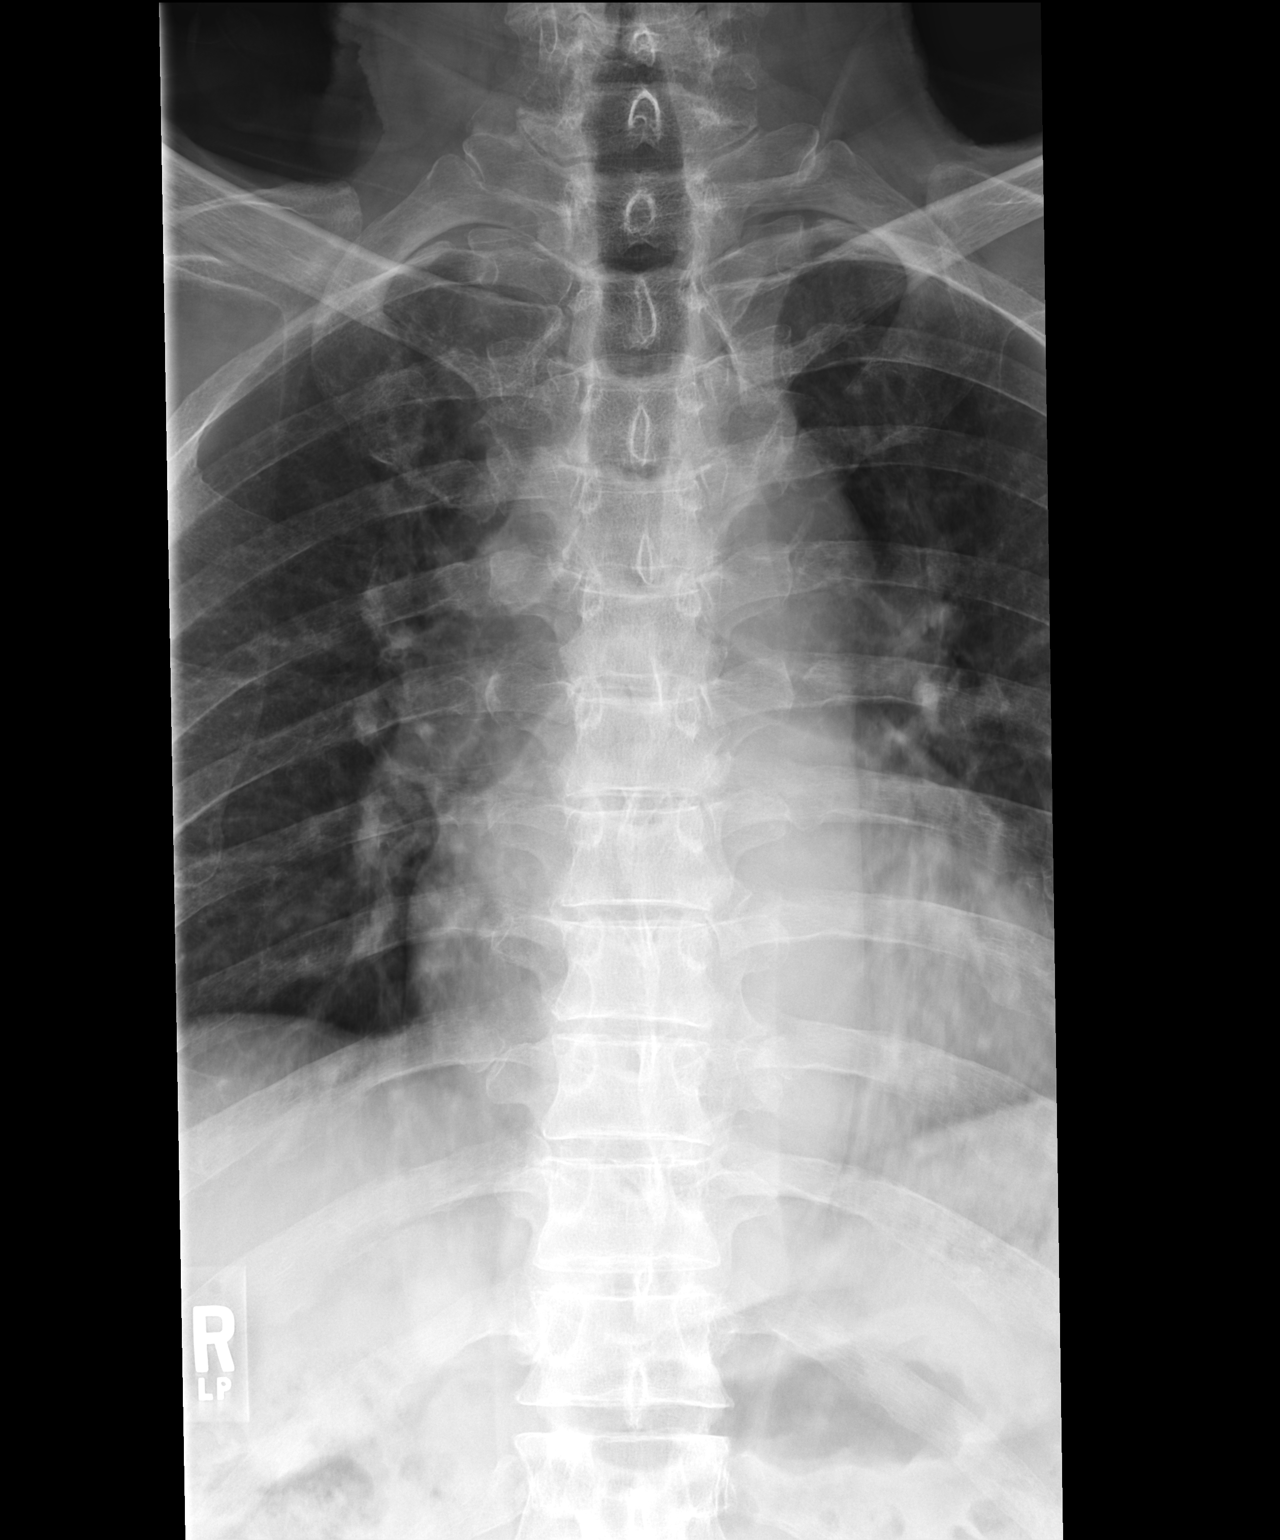
[im 2/2]
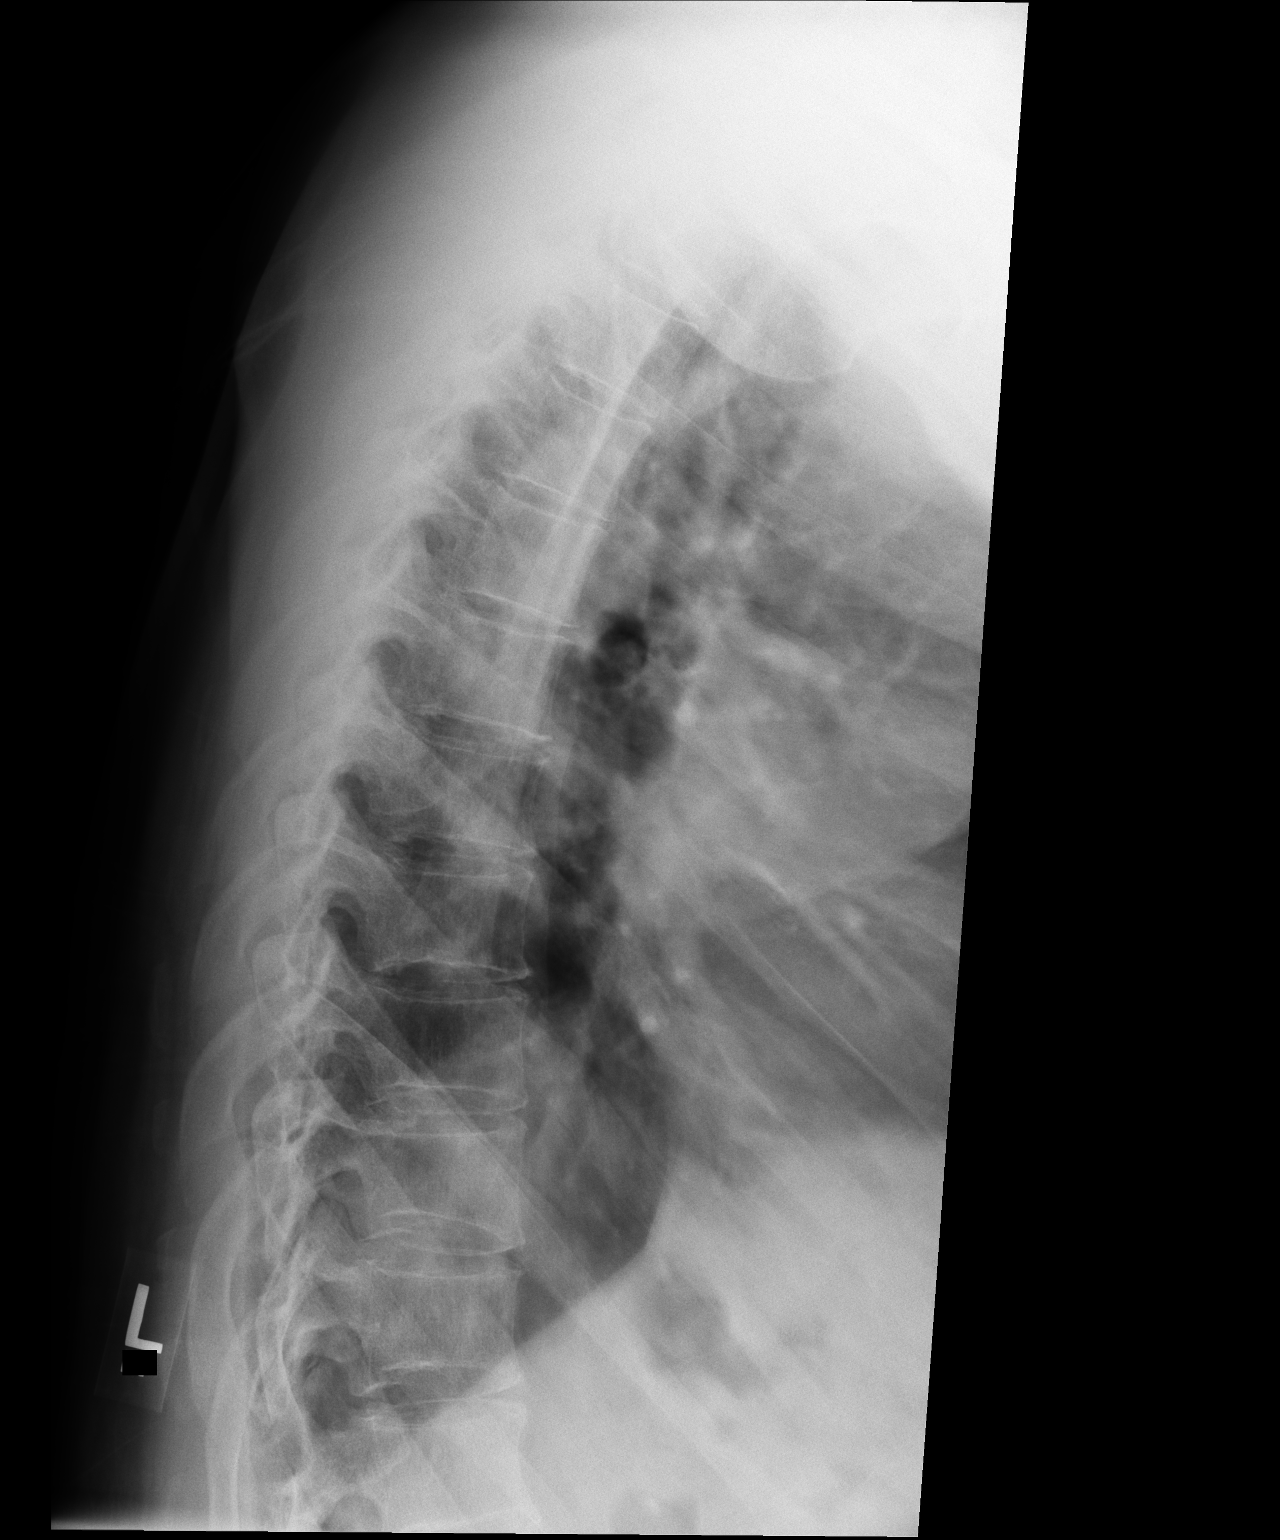

[2 of 2 positions shown; findings below may reference images not displayed]

FINDINGS: No fracture. Normal alignment. Joint spaces are preserved. No focal 
soft tissue swelling.
IMPRESSION: Negative exam.

## 2020-06-27 IMAGING — DX LUMBAR SPINE COMPLETE 4 VIEWS
1 series · 5 of 5 positions shown · non-contrast
Comparison: None prior of the lumbar spine.

LUMBAR SPINE COMPLETE 4 VIEWS, 06/27/2020 [DATE]: 
CLINICAL INDICATION:  Posterior neck pain. Mid and low back pain. Right knee 
numbness.

[Series 1: AP · U · 0.14mm/px · 5 of 5 slices shown]
[im 1/5]
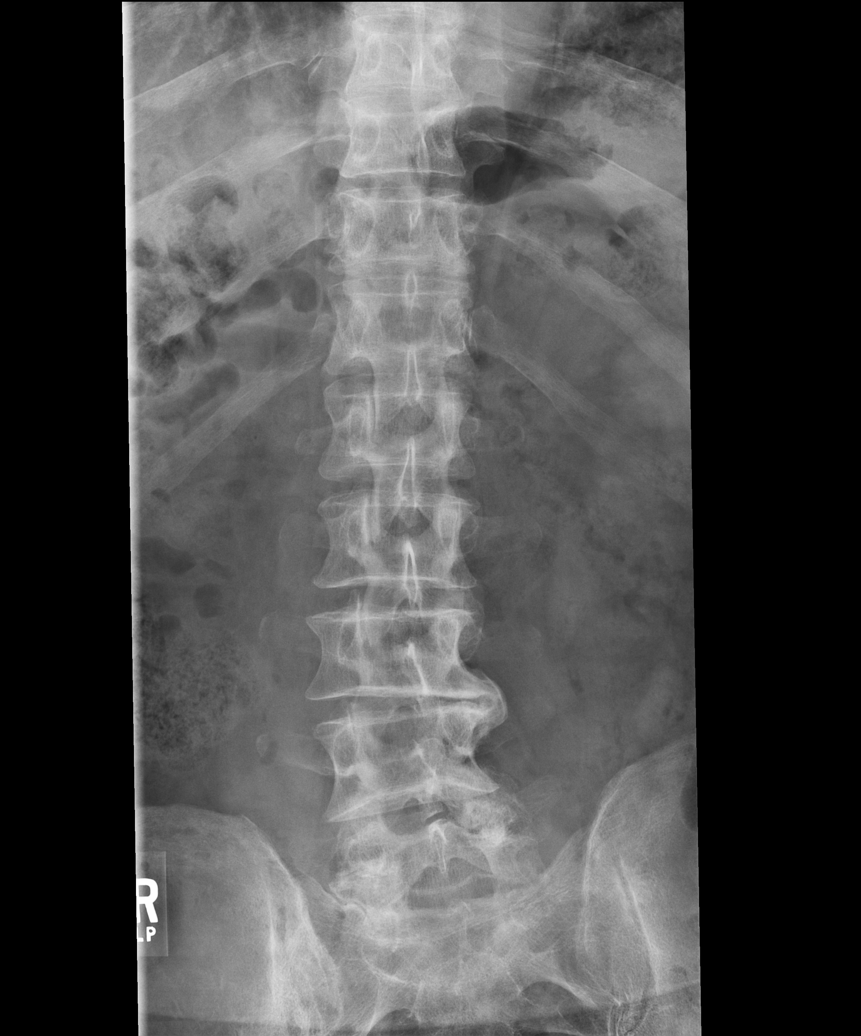
[im 2/5]
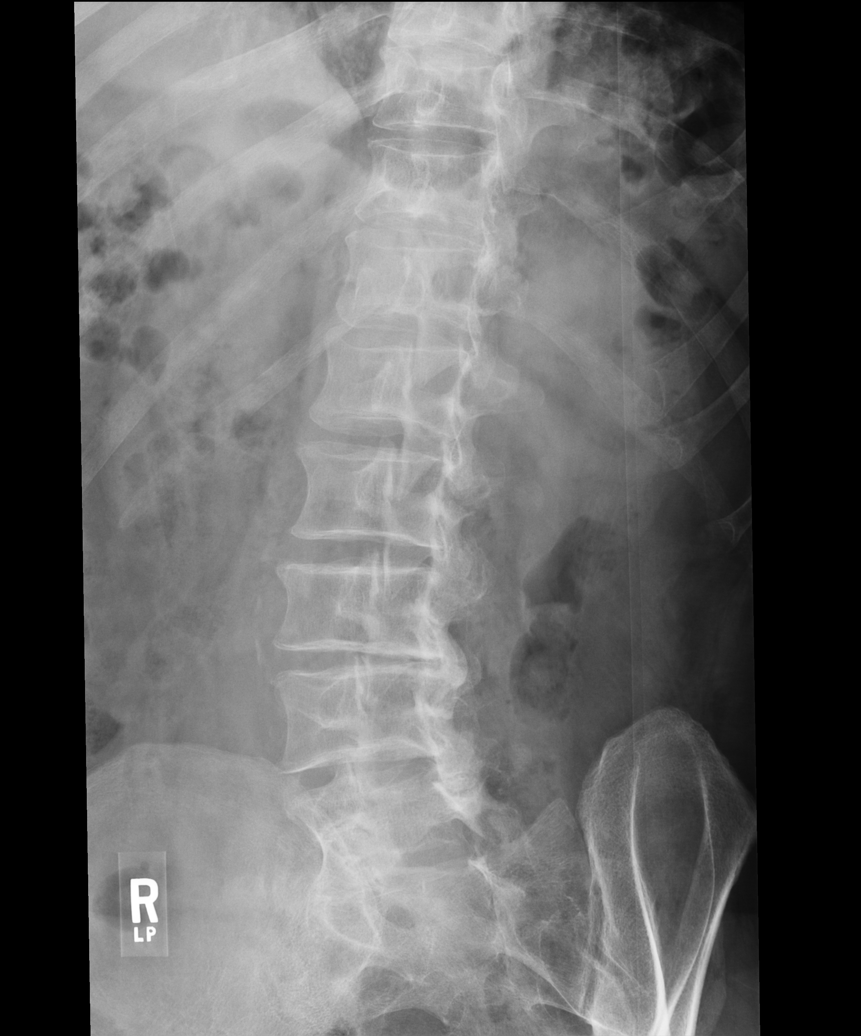
[im 3/5]
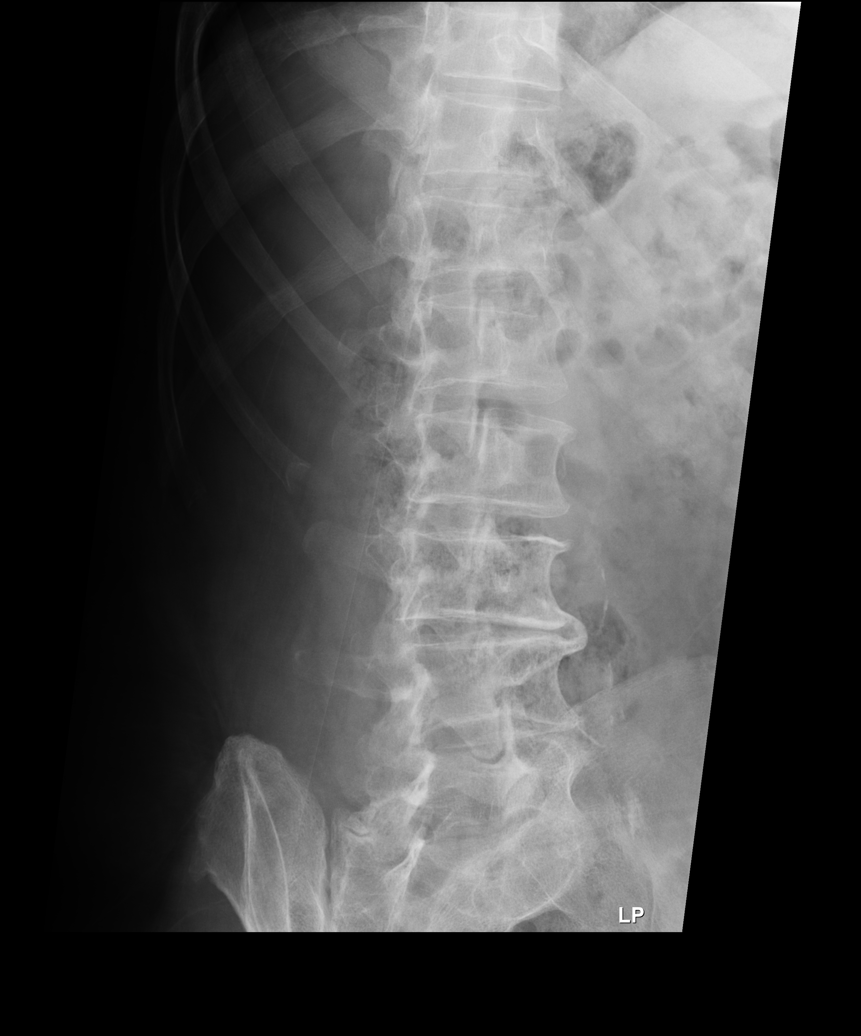
[im 4/5]
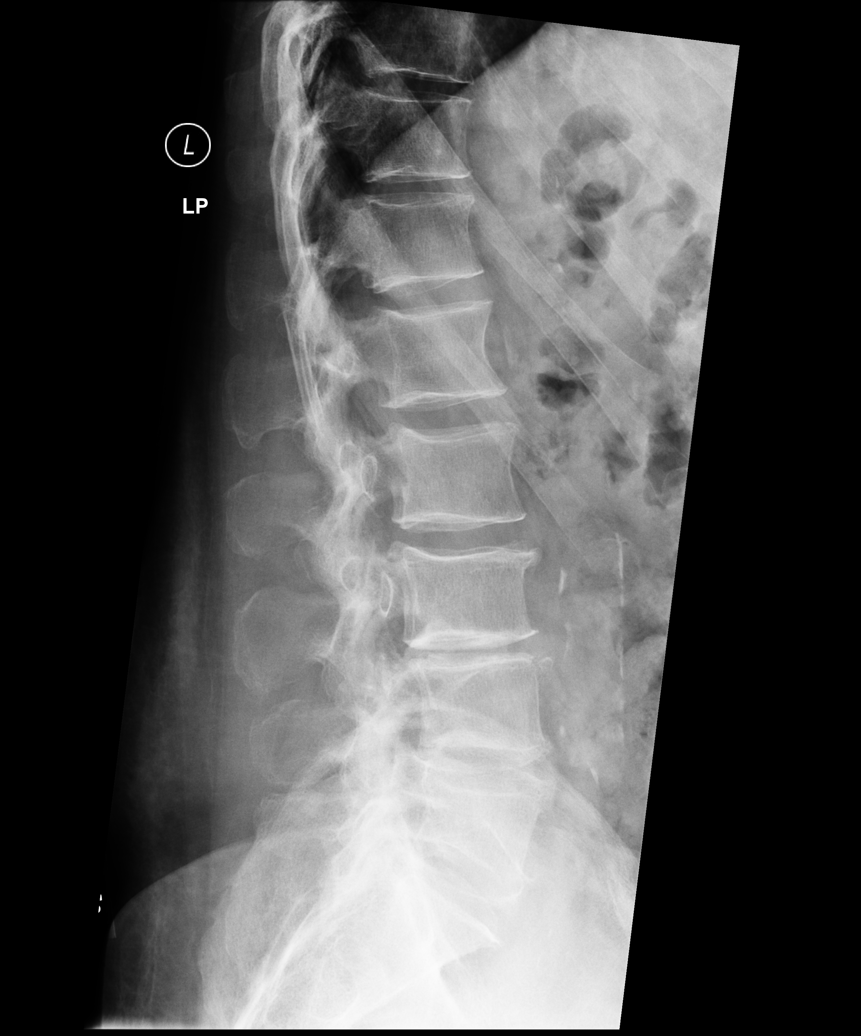
[im 5/5]
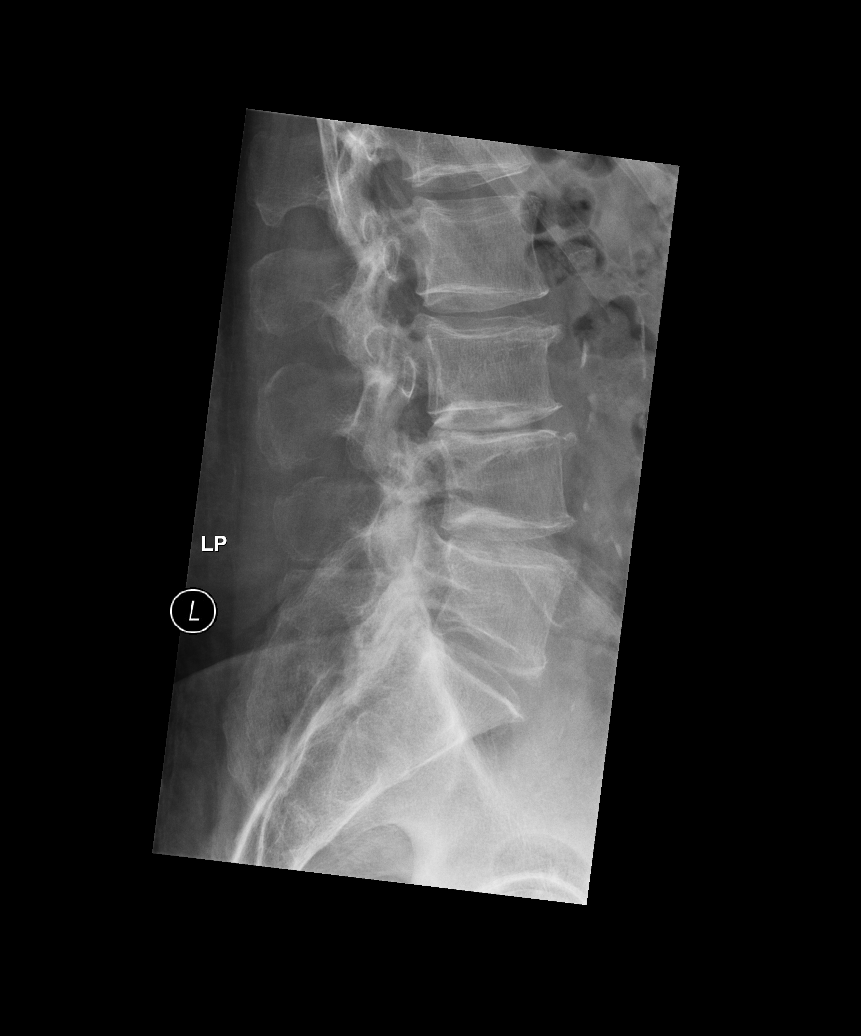

[5 of 5 positions shown; findings below may reference images not displayed]

FINDINGS: Mild dextrocurvature. Scattered disc space narrowing and osteophytic 
spurring greatest within the lower lumbar spine. Lower lumbar facet hypertrophy. 
No vertebral body fracture. No spondylolisthesis. Mild scattered vascular 
calcifications.
IMPRESSION: Dextrocurvature with moderately advanced spondylotic changes. If symptoms 
persist, recommend MR exam.

## 2020-07-10 IMAGING — MR MRI LUMBAR SPINE WITHOUT CONTRAST
4 of 7 series · 15 of 48 positions shown · IV contrast (gadolinium)
Comparison: Lumbar radiograph June 27, 2020

MRI LUMBAR SPINE WITHOUT CONTRAST, 07/10/2020 [DATE]: 
CLINICAL INDICATION: Chronic low back and right buttock pain, right sciatica 
extending to foot
TECHNIQUE: Sagittal T1, Sagittal T2, Sagittal STIR, Axial T1 and Axial T2 MR 
images of the lumbar spine were performed without intravenous gadolinium 
enhancement.

[Series 101: survey · axial · 10.0mm · 1.39mm/px · z∈[-33,+201]mm · 3 of 10 slices shown]
[im 1/10]
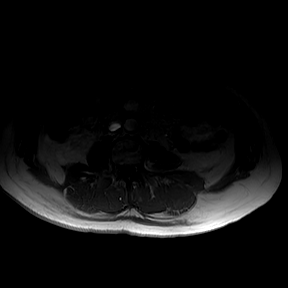
[im 7/10]
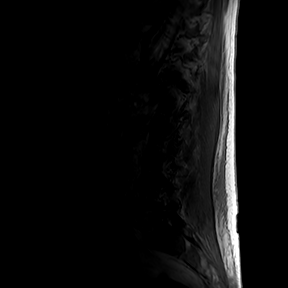
[im 10/10]
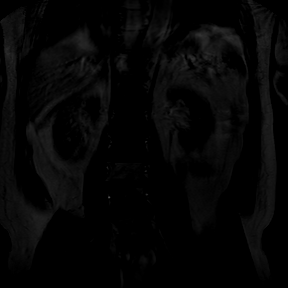

[Series 201: t2w_cor-surv · coronal · 6.0mm · 0.60mm/px · 2 of 5 slices shown]
[im 1/5]
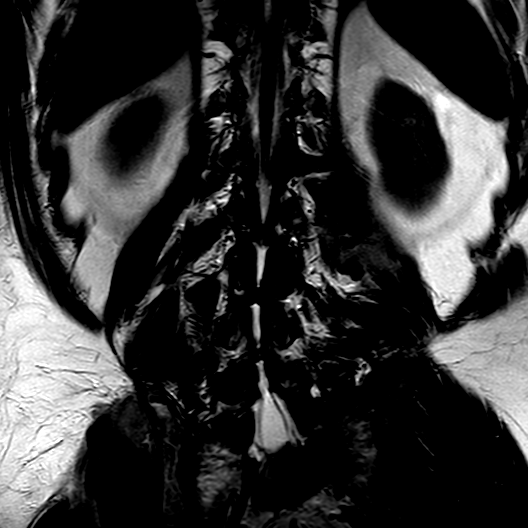
[im 5/5]
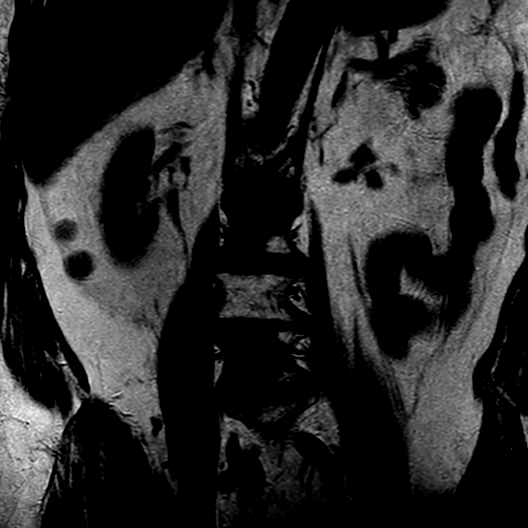

[Series 301: t1_tse_sag · sagittal · 4.0mm · 0.37mm/px · 3 of 17 slices shown]
[im 4/17]
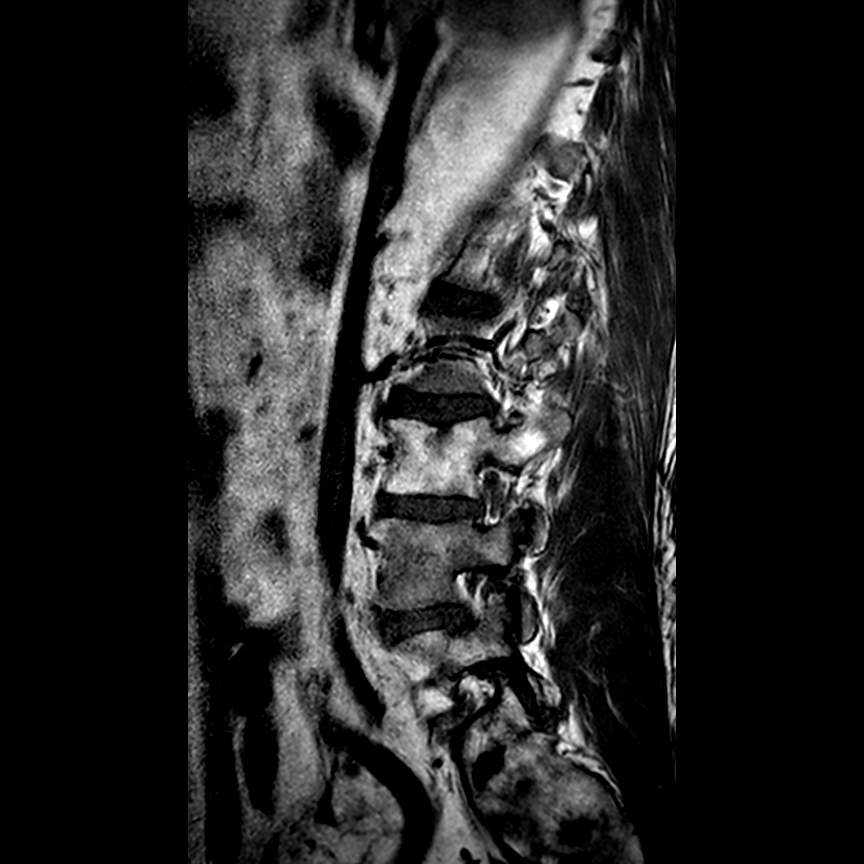
[im 10/17]
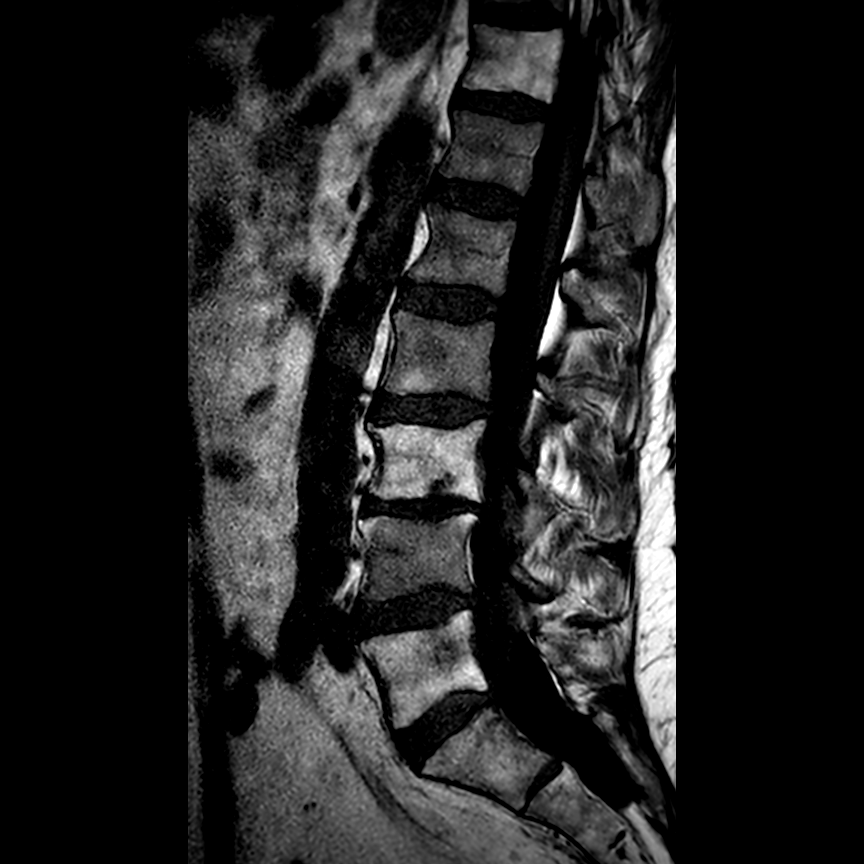
[im 17/17]
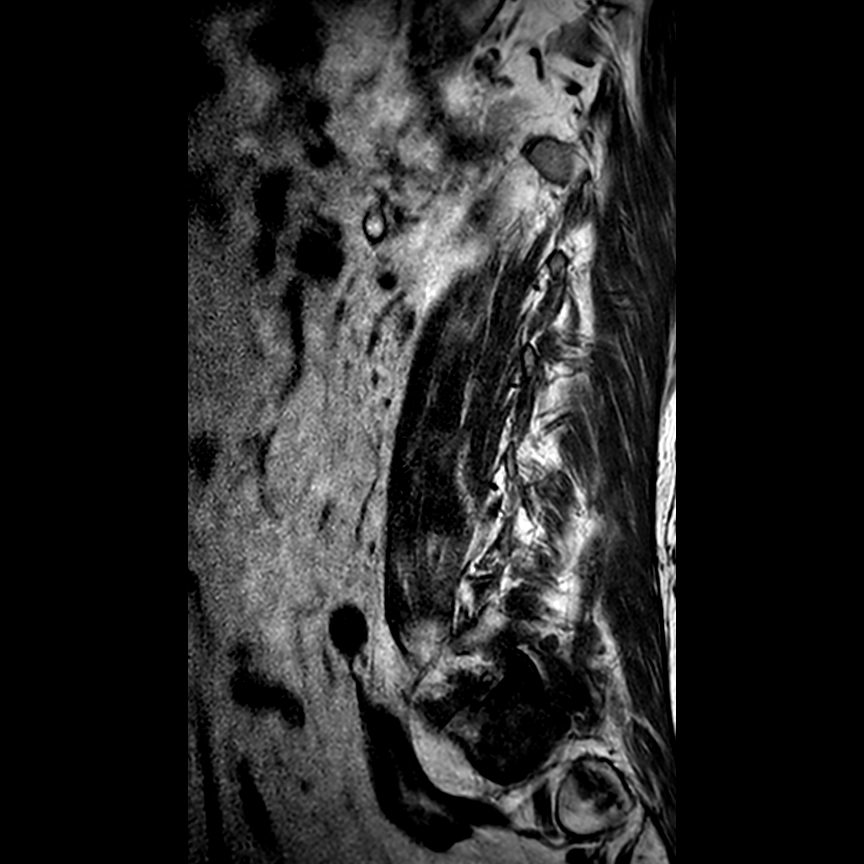

[Series 701: T1 · axial · 4.0mm · 0.38mm/px · z∈[-192,+29]mm · 7 of 35 slices shown]
[im 1/35]
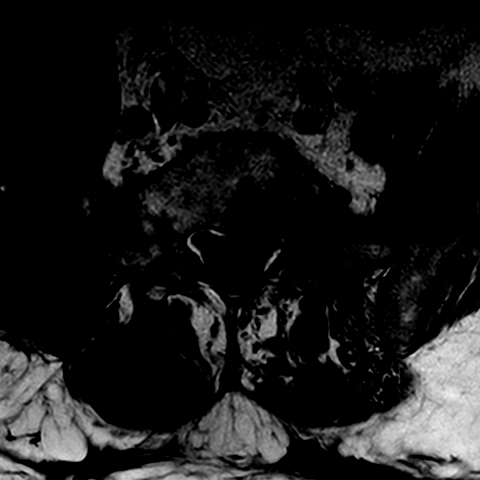
[im 6/35]
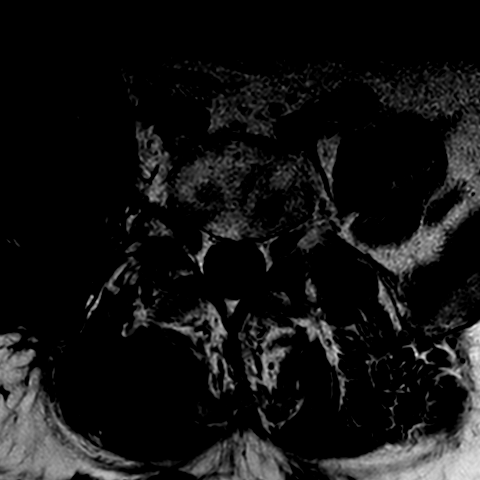
[im 12/35]
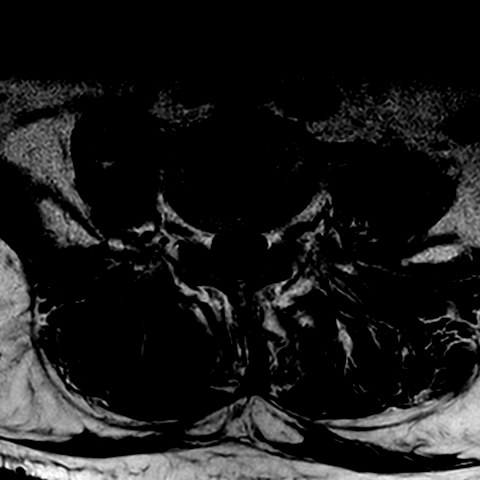
[im 15/35]
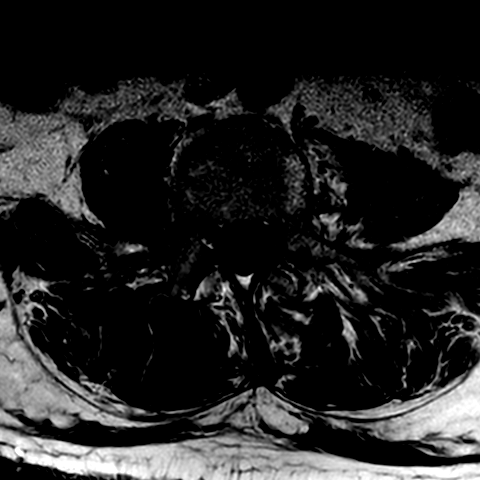
[im 18/35]
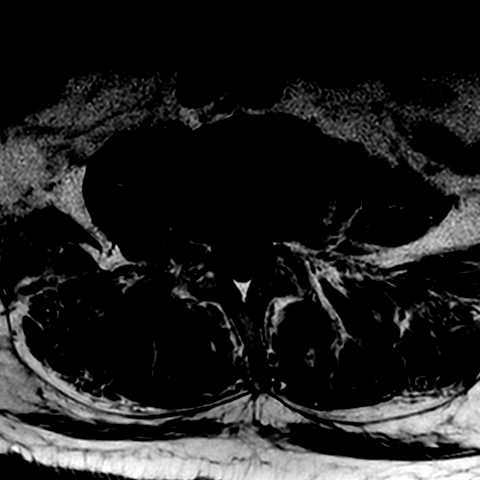
[im 20/35]
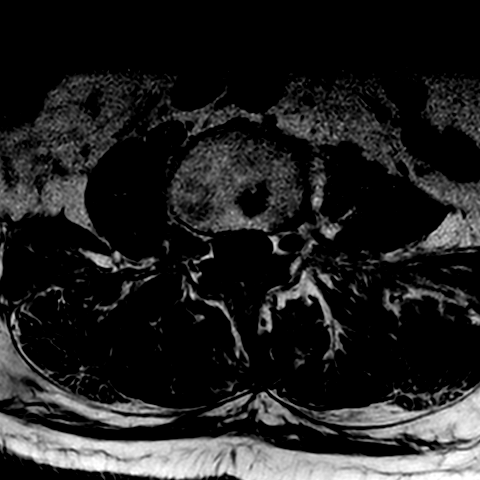
[im 29/35]
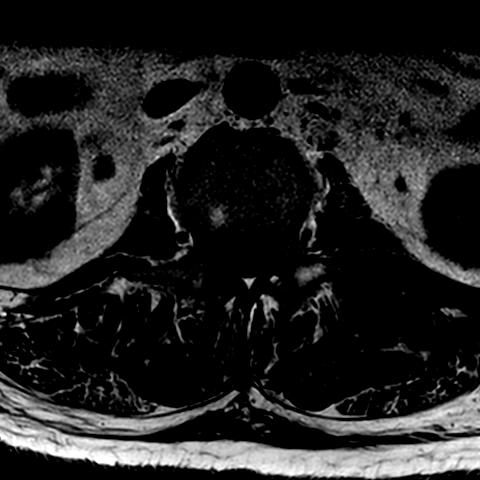

[15 of 48 positions shown; findings below may reference images not displayed]

FINDINGS: The T1-weighted images show hyperintense signal at L5, L3 and T11, not 
showing conspicuous increased signal on STIR images, likely representing large 
hemangiomas. There is abnormal signal within the left upper sacrum showing T2 
hyperintensity, concerning for neoplastic involvement. There is a Schmorl's node 
along the inferior L3 endplate, and smaller Schmorl's nodes elsewhere. 
No evidence for compression fracture. Lower cord and conus show normal signal. 
There is mild lumbar dextroscoliosis. 
At L5-S1 the canal is open. There is abnormal soft tissue within the upper left 
S1 lateral recess seen on axial T2 image 1, sagittal image 5 measuring 
approximately 5 mm. There is mild right foraminal stenosis. 
L4-5 there is a mild left paracentral disc bulge, and moderate facet change with 
ligamentous thickening. There is mild stenosis of the upper left L5 lateral 
recess, axial T2 image 8. There is no significant foraminal stenosis. 
At L3-4 there is mild disc bulge and mild facet change with ligamentous 
thickening. There is no significant canal stenosis. There is mild to moderate 
right foraminal stenosis. There is loss of normal fat signal in the right 
foramen seen on axial image 14. Image 13 shows a 2.6 x 1.9 cm soft tissue mass 
lateral to the foramen, abutting the posterior margin of the psoas muscle. Left 
foramen is open. 
At L2-3 the canal and foramina remain open. 
At L1-2 the canal and foramina remain open. 
On axial T2 images there is abnormal appearance of the paraspinal musculature at 
the L4-5 level, most pronounced on the right on image 4. This extends 
approximately 5.4 x 3.4 cm, mildly hypointense to muscle on T2 images, 
isointense on T1-weighted images. These findings are concerning for metastatic 
disease or other neoplasm. Similar changes are seen in the left paraspinal 
musculature at the L2-3 level extending approximately 3.4 x 3.3 cm. There is no 
history of prior lumbar surgery. Fat suppressed enhanced images of the lumbar 
spine recommended.
IMPRESSION: Abnormal appearance of paraspinal musculature as described on the right at L4-5, 
on the left at L2-3. There is also abnormal soft tissue within and lateral to 
the right L3-4 neural foramen. These findings potentially represent metastatic 
disease or other neoplasm. Follow-up with enhanced fat suppressed MRI of the 
lumbar spine recommended. 
There is abnormal marrow signal in the upper left sacrum, concerning for 
metastasis or other neoplasm. Abnormal soft tissue in the upper left S1 lateral 
recess measures 5 mm, impinging the left S1 nerve root. This likely represents 
disc extrusion, but nerve sheath tumor or neoplastic extension, and this can 
also be further evaluated with fat suppressed enhanced images.

## 2020-07-14 IMAGING — MR MRI CERVICAL SPINE WITHOUT CONTRAST
4 of 5 series · 26 of 48 positions shown · IV contrast (gadolinium)
Comparison: Cervical radiograph June 27, 2020

MRI CERVICAL SPINE WITHOUT CONTRAST, 07/14/2020 [DATE]: 
CLINICAL INDICATION: Chronic neck pain, right-sided radiculopathy
TECHNIQUE: Sagittal T1, Sagittal T2, Sagittal STIR, Axial TSE and Axial NDWWJ 
images of the cervical spine were performed without intravenous gadolinium 
enhancement.

[Series 101: survey* · axial · 10.0mm · 1.56mm/px · z∈[-30,+199]mm · 9 of 15 slices shown]
[im 1/15]
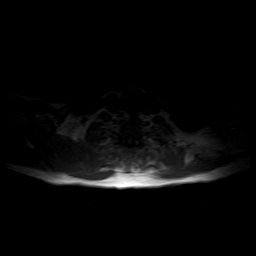
[im 2/15]
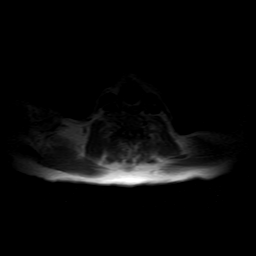
[im 4/15]
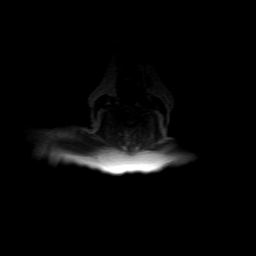
[im 6/15]
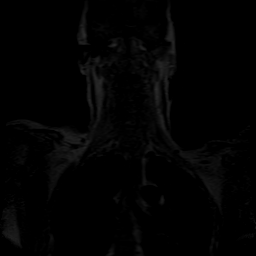
[im 8/15]
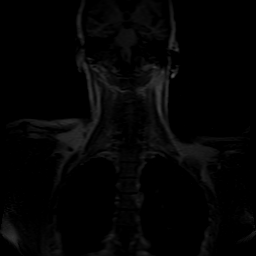
[im 9/15]
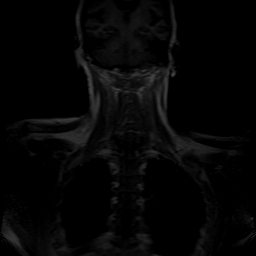
[im 11/15]
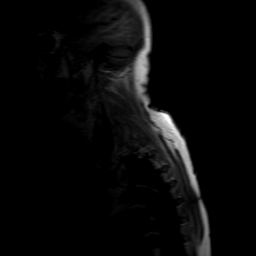
[im 13/15]
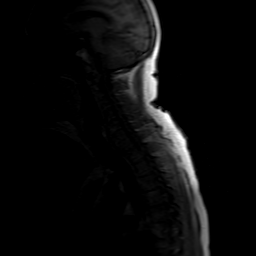
[im 15/15]
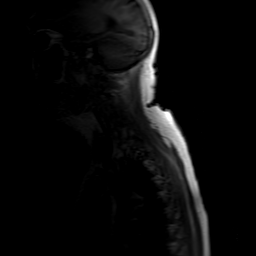

[Series 201: t2w_cor-surv · coronal · 5.0mm · 0.85mm/px · 4 of 7 slices shown]
[im 1/7]
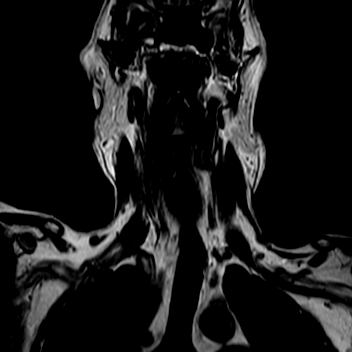
[im 3/7]
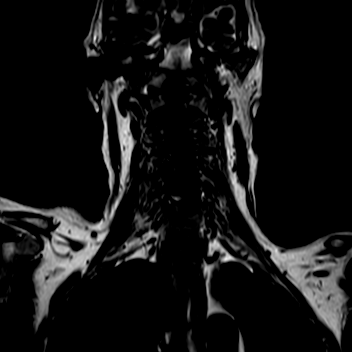
[im 5/7]
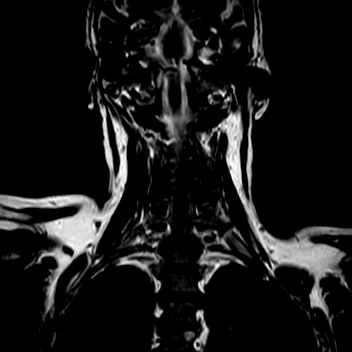
[im 7/7]
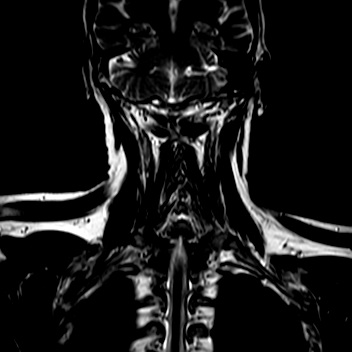

[Series 301: t1_sag · sagittal · 3.0mm · 0.34mm/px · 8 of 15 slices shown]
[im 1/15]
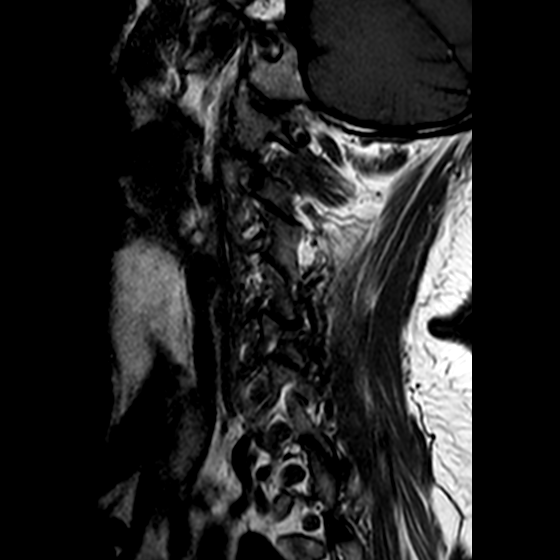
[im 3/15]
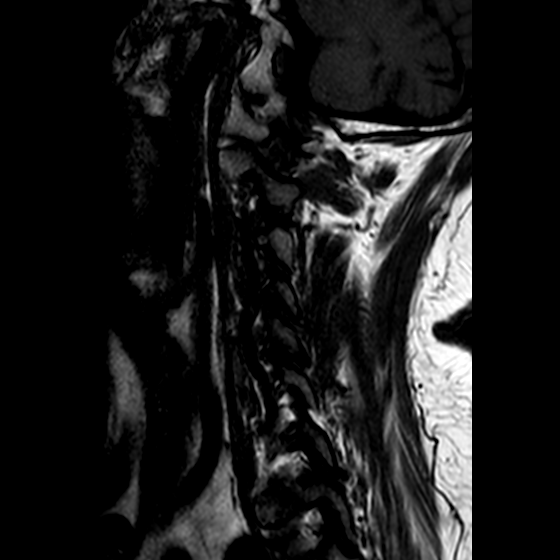
[im 5/15]
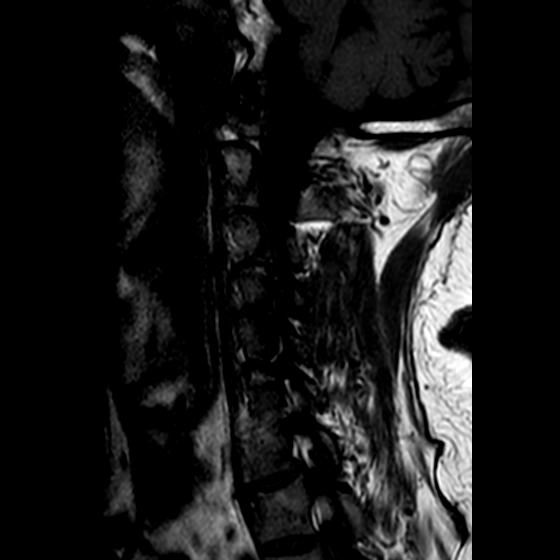
[im 7/15]
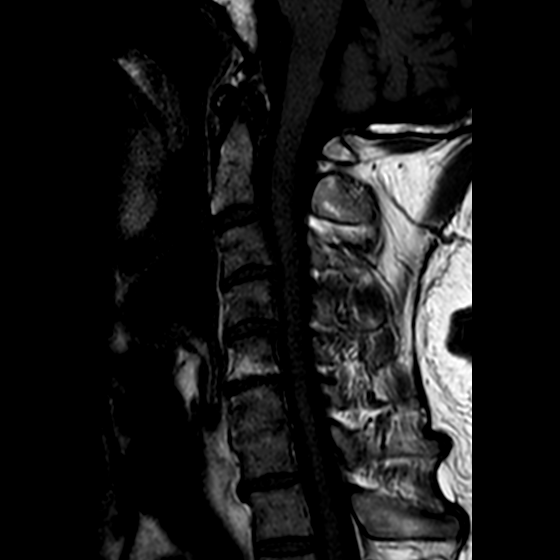
[im 9/15]
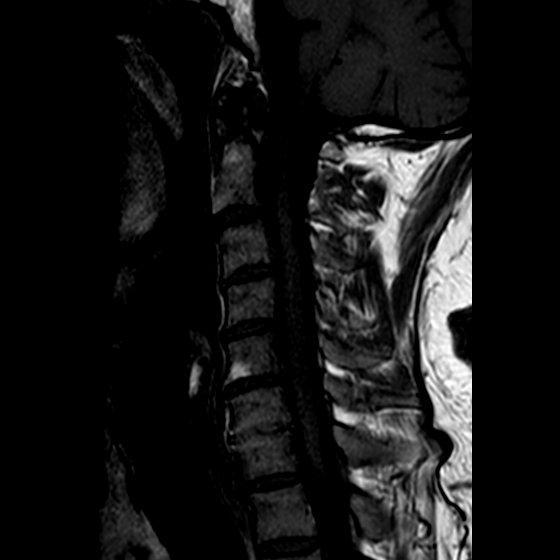
[im 11/15]
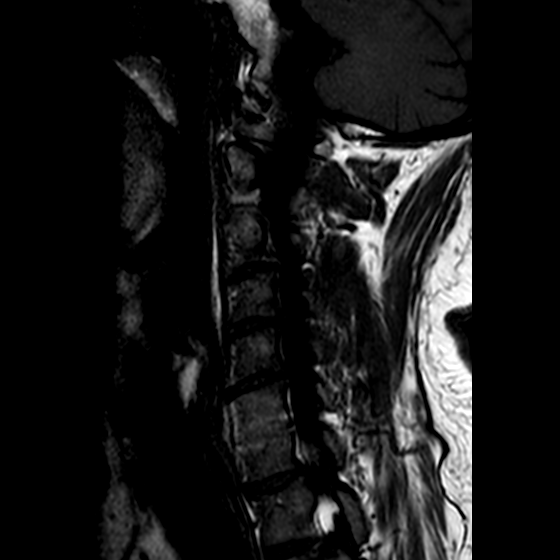
[im 13/15]
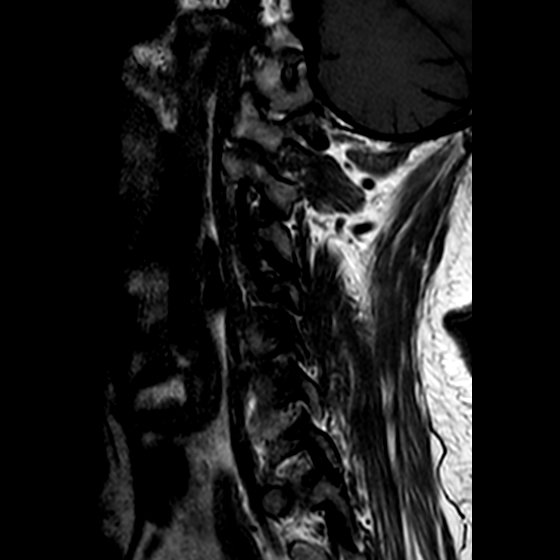
[im 15/15]
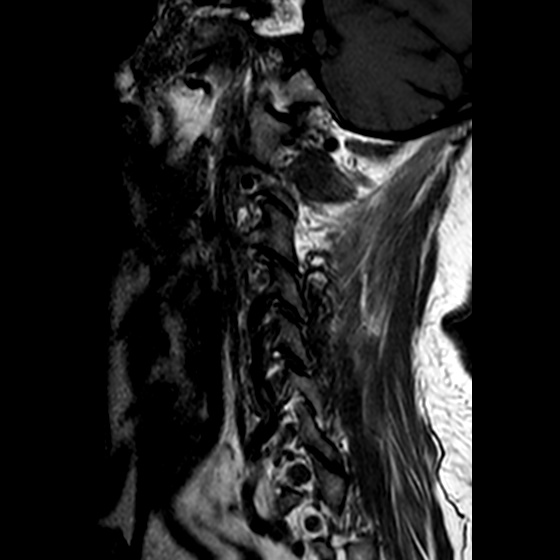

[Series 401: t2_sag · sagittal · 3.0mm · 0.36mm/px · 5 of 15 slices shown]
[im 1/15]
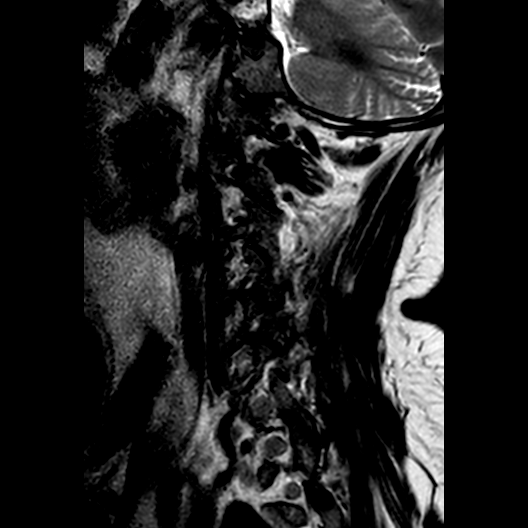
[im 3/15]
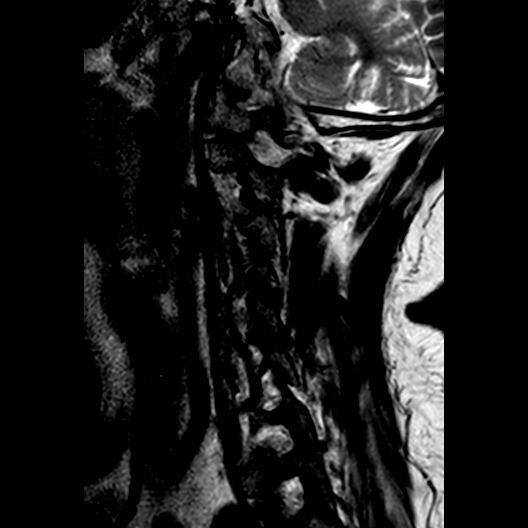
[im 5/15]
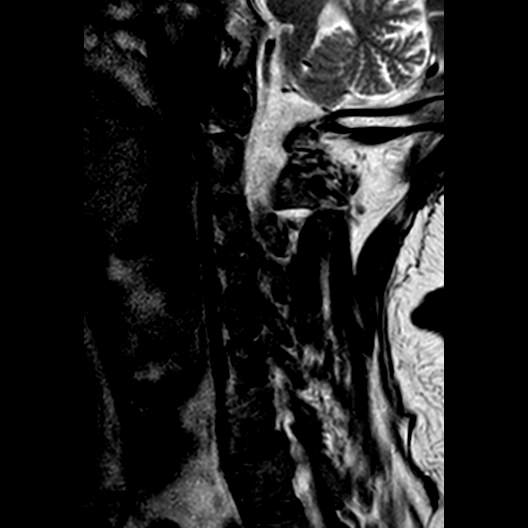
[im 9/15]
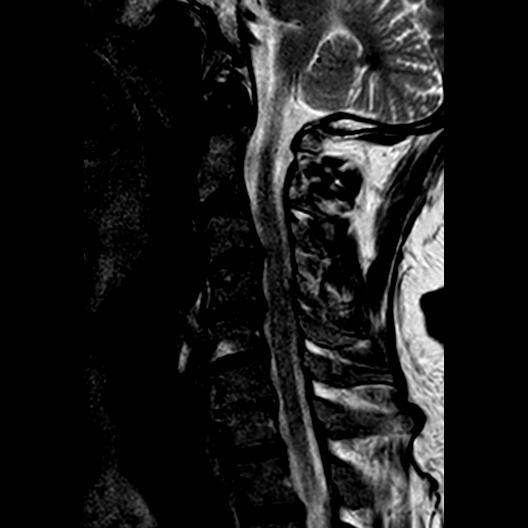
[im 13/15]
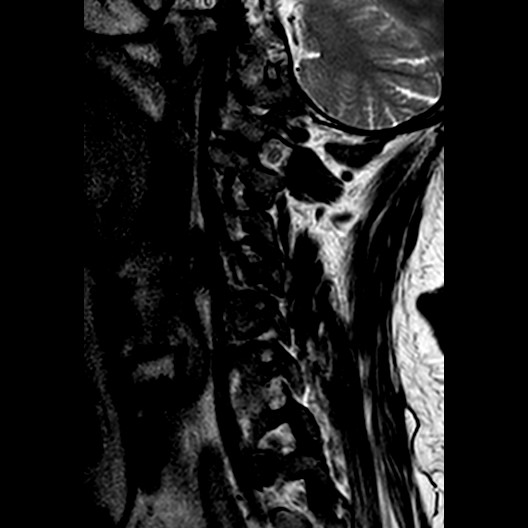

[26 of 48 positions shown; findings below may reference images not displayed]

FINDINGS: Cervical vertebral heights are intact. There is spondylosis, with 
marked disc narrowing at C5-6 and C6-7, moderate to marked C3-4. Lordosis is 
straightened. Cord signal is normal. The craniocervical junction is open. The 
dens and atlanto-dental interval are intact. Lower posterior fossa contents are 
unremarkable. There is no evidence for recent fracture or spinal malignancy. 
There are zygapophyseal facet degenerative changes. 
There is mild effacement left ventral cord at C3-4 due to disc-osteophyte best 
seen on sagittal images [DATE]. There is mild canal stenosis at this level. At C4-5 
disc bulge and mild endplate osteophyte approximates the cord without deformity, 
with mild canal stenosis. There is borderline canal stenosis at C5-6 and C6-7. 
Axial images from C2-C3 through C7-T1 show marked bilateral foraminal stenosis 
at C3-4, and on the right at C4-5 with moderate to marked left C4-5 foraminal 
stenosis. There is moderate to marked bilateral foraminal stenosis at C5-6. 
Other foramina appear open.. 
Note made of ectasia of the left internal carotid artery extending medially, 
posterior to the oropharyngeal mucosa.
IMPRESSION: Cervical spondylosis. There is mild effacement of the ventral cord at C3-4 due 
to disc-osteophyte. There is mild canal stenosis at C3-4 and C4-5. 
Marked bilateral foraminal stenosis at C3-4 and on the right at C4-5, with 
moderate to marked bilateral foraminal stenosis at C5-6, on the left at C4-5. 
Straightened lordosis could indicate spasm. 
No evidence for fracture or spinal malignancy.

## 2020-07-24 IMAGING — PT PET CT SCAN TUMOR IMAGING SKULL TO THIGH
2 series · 25 of 25 positions shown · non-contrast
Comparison: CT scan also from 07/24/2020 and recent MRI the lumbar spine

PET CT SCAN TUMOR IMAGING SKULL TO THIGH, 07/24/2020 [DATE]: 
CLINICAL INDICATION:  Weight loss. Abnormal MRI the lumbar spine.
TECHNIQUE: A dose of 13.2 millicuries of 18-FDG was administered intravenously 
and skull to thigh PET scanning was performed at 65 minutes. Tomographic scans 
were reconstructed in axial, coronal, and sagittal projections. The data was 
reconstructed into a three-dimensional volume rendered image and reviewed in a 
rotational cine loop. Serum blood glucose at the time of injection was 106 
mg/dl.

[Series 201: body-low dose ct, idose (4) · axial · 4.0mm · 1.17mm/px · z∈[+580,+1596]mm · 13 of 255 slices shown]
[im 1/255]
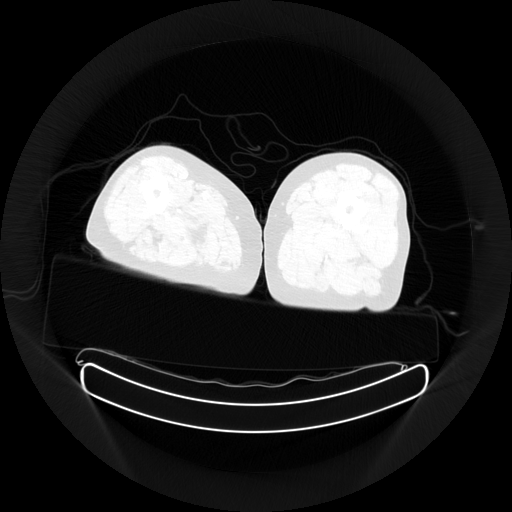
[im 22/255]
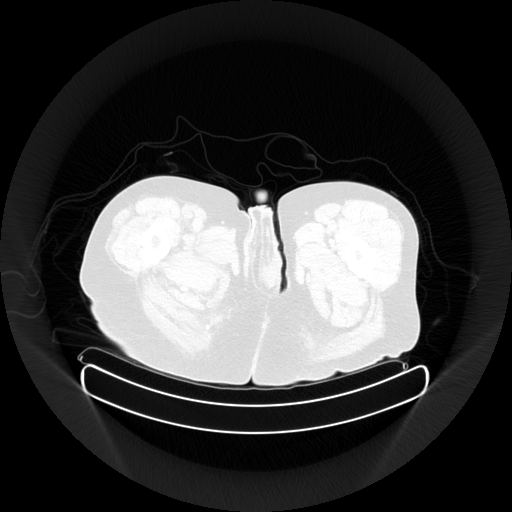
[im 43/255]
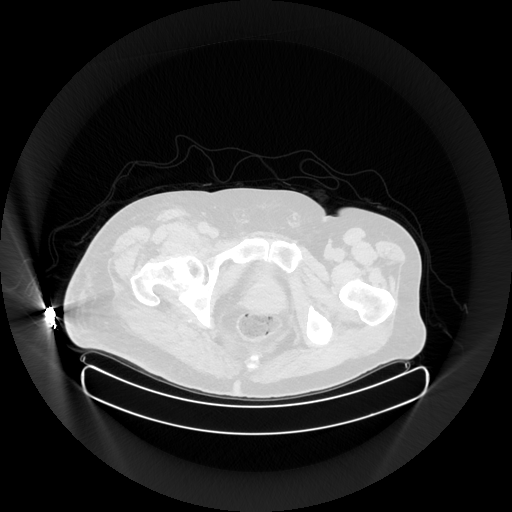
[im 64/255]
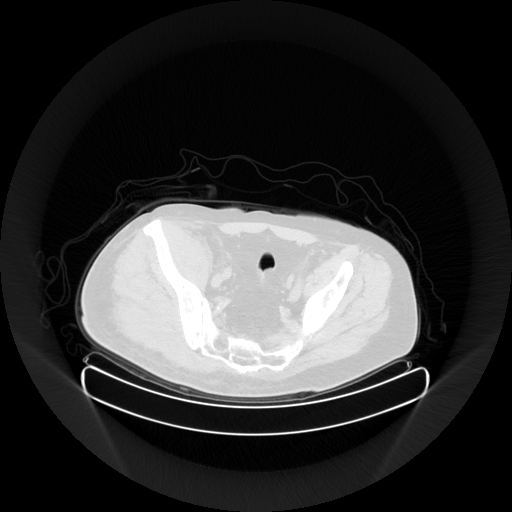
[im 85/255]
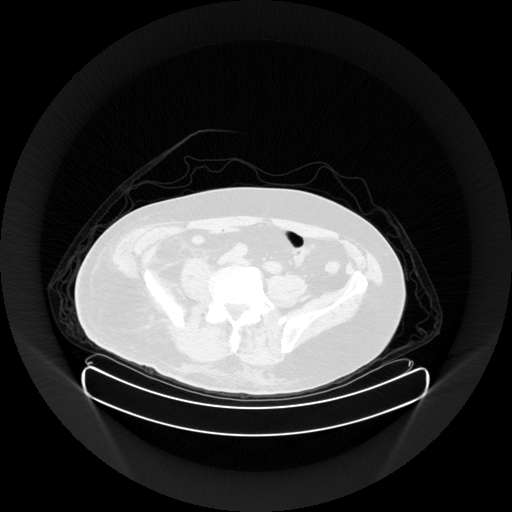
[im 106/255]
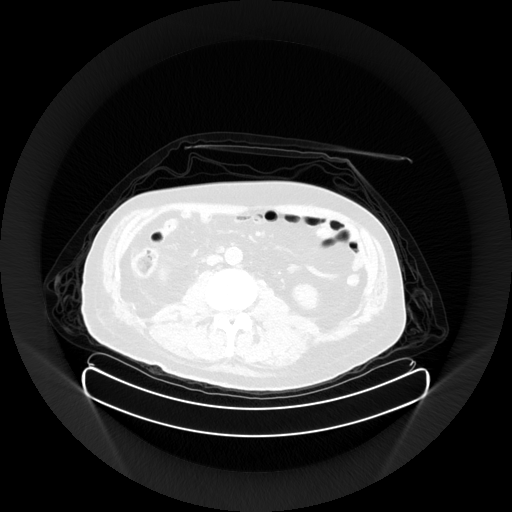
[im 128/255]
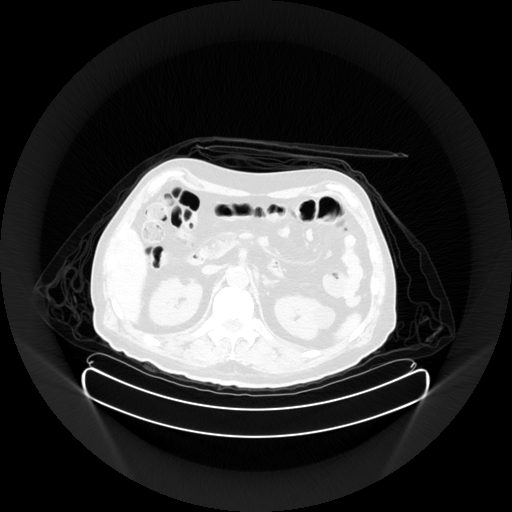
[im 149/255]
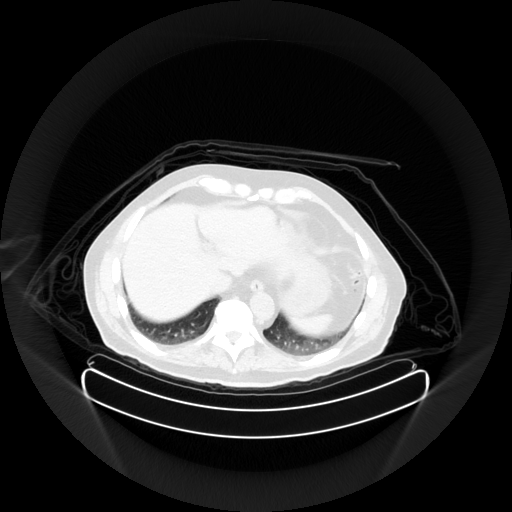
[im 170/255]
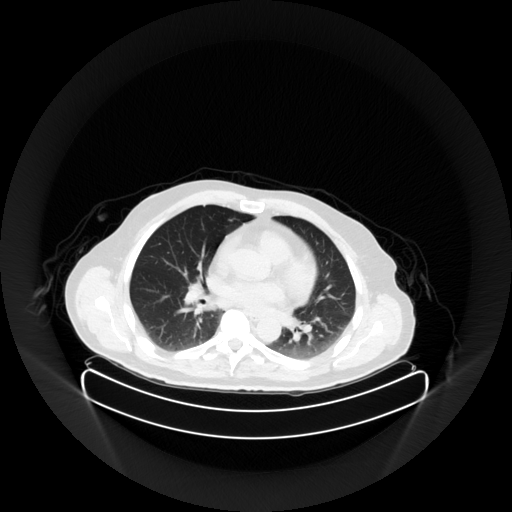
[im 191/255]
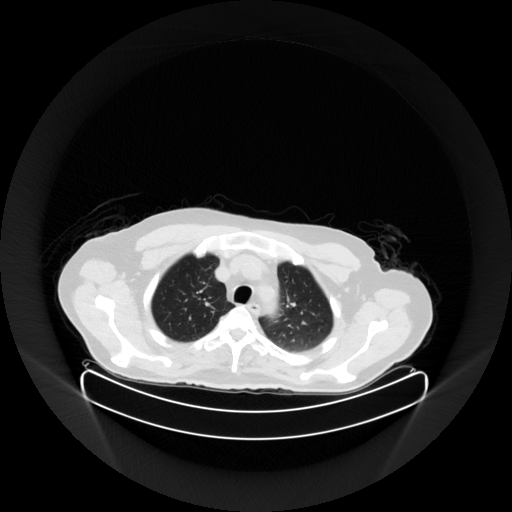
[im 212/255]
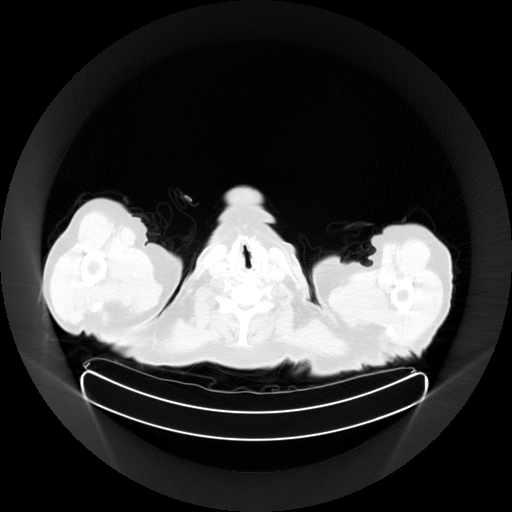
[im 233/255]
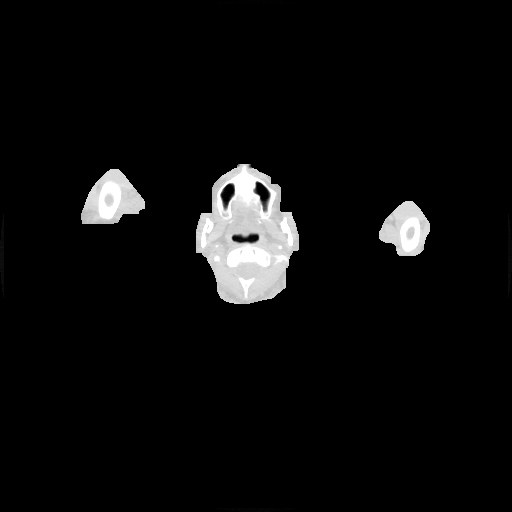
[im 255/255  brain]
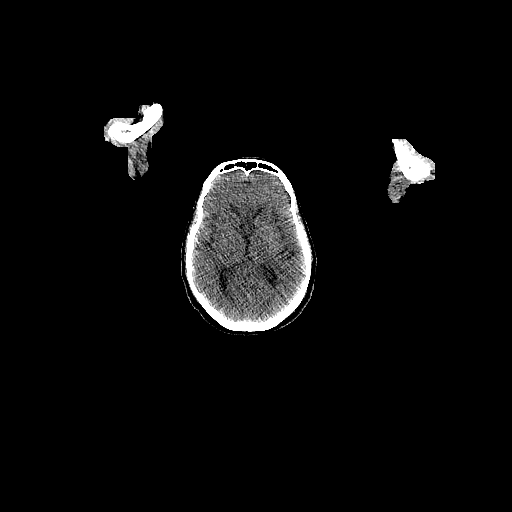

[Series 8046: (wb_ctac) body · axial · 4.0mm · 4.00mm/px · z∈[+580,+1596]mm · 12 of 255 slices shown]
[im 1/255]
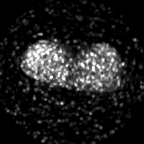
[im 24/255]
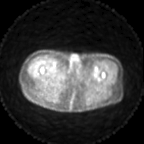
[im 47/255]
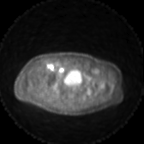
[im 70/255]
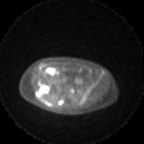
[im 93/255]
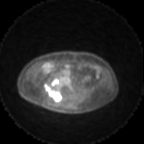
[im 116/255]
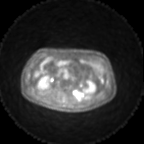
[im 139/255]
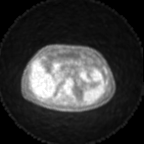
[im 162/255]
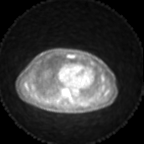
[im 185/255]
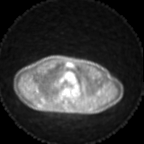
[im 208/255]
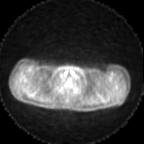
[im 231/255]
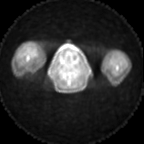
[im 255/255]
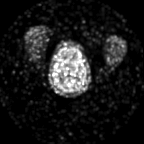

[25 of 25 positions shown; findings below may reference images not displayed]

FINDINGS: NECK/CHEST: No abnormal FDG uptake. 
ABDOMEN/PELVIS: No abnormal FDG uptake. 
MUSCULOSKELETAL: There is diffuse abnormal FDG activity involving the 
paraspinous musculature bilaterally. On the left this has a maximum SUV of
and on the right 12.3. This extends down into the right psoas muscle and right 
pelvic musculature including the gluteus musculature. This ranges in SUV values 
from 9.6 up to 13.1. There is a sacral lesion identified with a maximum SUV of 
15.2 as well as a right fifth rib lesion maximum SUV of 7.7. 
ADDITIONAL CT FUSION FINDINGS: Atherosclerotic changes and degenerative changes.
IMPRESSION: Extensive what is concerning for neoplasm involving the paraspinous musculature 
bilaterally and extending into the pelvic musculature on the right and right 
gluteus musculature. At least 2 bony lesions are seen. The right gluteus maximus 
muscle lesion would be the easiest for biopsy.

## 2020-07-24 IMAGING — CT CT CHEST/ABDOMEN/PELVIS WITH CONTRAST
1 of 7 series · 1 of 46 positions shown, 3 images · IV contrast (Iodine)
Comparison: Comparison was made to the prior exam(s) within the last 12 months 
PET scan also from 07/24/2020 and MRI from July 10, 2020

CT CHEST/ABDOMEN/PELVIS WITH CONTRAST, 07/24/2020 [DATE]: 
CLINICAL INDICATION:  Weight loss. History of prostate carcinoma diagnosed 3 
years ago. 
A search for DICOM formatted images was conducted for prior CT imaging studies 
completed at a non-affiliated media free facility.
TECHNIQUE: The chest, abdomen and pelvis were scanned from base of neck through 
the pubic rami with 100 mL of Isovue 300 injected intravenously on a high 
resolution low dose CT scanner. Routine MPR and MIP 3D renderings were 
reconstructed on an independent workstation with concurrent physician 
supervision.

[Series 200: locator · axial · 0.98mm/px · z∈[+1298,+1298]mm · 1 of 1 slices shown, 3 images]
[im 1/1  soft-tissue]
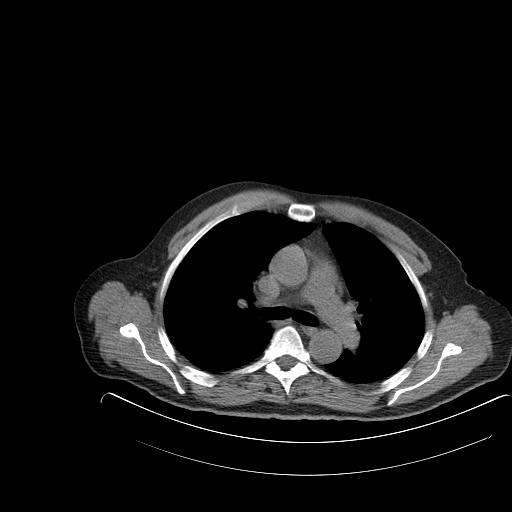
[im 1/1  lung]
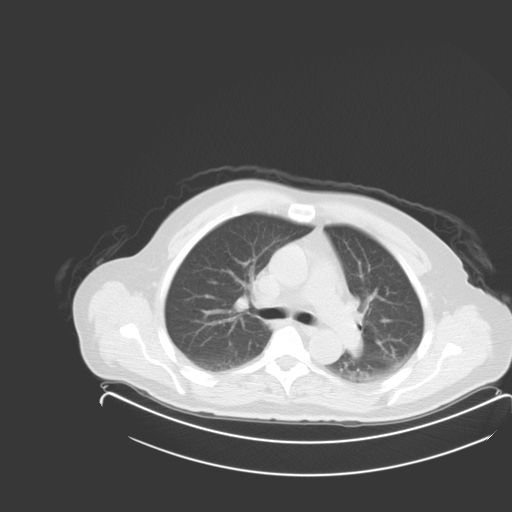
[im 1/1  bone]
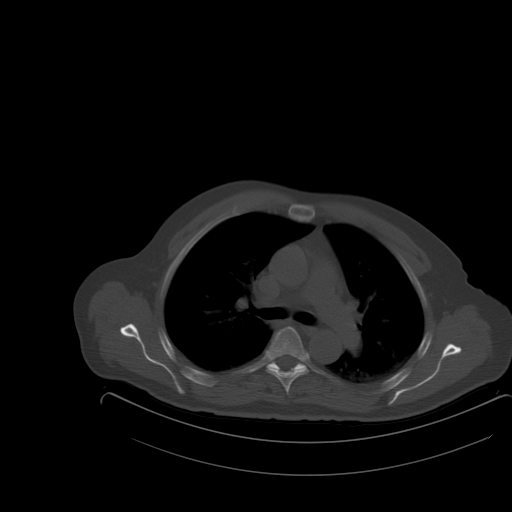

[1 of 46 positions shown; findings below may reference images not displayed]

FINDINGS: LUNGS AND PLEURA:  Few scattered micronodules are identified. No suspicious 
mass. 
MEDIASTINUM:  No adenopathy. Normal heart size. No pericardial effusion. 
CHEST WALL/AXILLA: No mass or adenopathy. 
HEPATOBILIARY: No evidence for mass or biliary dilatation. No gallstones. 
SPLEEN: Normal in size. 
PANCREAS: No ductal dilatation or mass. 
ADRENALS: No mass. 
GENITOURINARY: No evidence for enhancing mass, stones or hydronephrosis. No 
bladder mass. 
LYMPH NODES: No adenopathy. 
STOMACH, SMALL BOWEL AND COLON: No bowel wall thickening or obstruction. 
VASCULAR STRUCTURES: Atherosclerotic changes without aneurysmal dilatation. 
MUSCULOSKELETAL: There is extensive enhancement without well-defined masses 
involving the posterior paraspinous musculature on the left at the T12 level 
extending down through L4. This is then also seen on the right beginning more 
inferiorly involving the posterior paraspinous musculature, psoas muscle and 
iliacus muscle on the right and extends back down into the gluteus muscles on 
the right. This is extensive soft tissue enhancement with what appears to be 
infiltrative mass. I do not see discrete osseous lesion though there are 
multiple FDG avid lesions seen without good CT correlate. 
ADDITIONAL FINDINGS: Mildly enlarged.
IMPRESSION: Large infiltrative enhancement and suspected neoplasm involving the paraspinous 
musculature bilaterally and tracking down into the right psoas and iliacus 
muscle as well as right gluteus muscles. Multiple bony lesions seen also on the 
PET images. Findings concerning for malignancy with sarcoma being high in the 
differential given the pattern. The right gluteus abnormality would be amenable 
to biopsy. 
RADIATION DOSE REDUCTION: All CT scans are performed using radiation dose 
reduction techniques, when applicable.  Technical factors are evaluated and 
adjusted to ensure appropriate moderation of exposure.  Automated dose 
management technology is applied to adjust the radiation doses to minimize 
exposure while achieving diagnostic quality images.

## 2020-10-27 IMAGING — MR MRI LUMBAR SPINE W/WO CONTRAST
6 of 11 series · 18 of 48 positions shown · IV contrast (gadavist)
Comparison: Nonenhanced lumbar MRI July 10, 2020

FINAL Diagnostic Imaging Report 
________________________________________________________________________________________________ 
MRI LUMBAR SPINE W/WO CONTRAST, 10/27/2020 [DATE]: 
CLINICAL INDICATION: Prostate, follow-up chemotherapy, paraspinal lesions
TECHNIQUE: Multiplanar, multiecho position MR images of the lumbar spine were 
performed without and with 10 mL of Gadavist 10 injected intravenously. Patient 
was scanned on a 1.5T magnet.

[Series 101: survey · axial · 10.0mm · 1.39mm/px · z∈[-14,+200]mm · 3 of 9 slices shown]
[im 1/9]
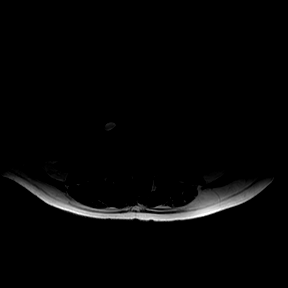
[im 5/9]
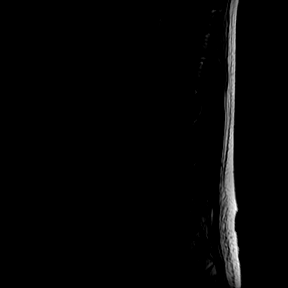
[im 9/9]
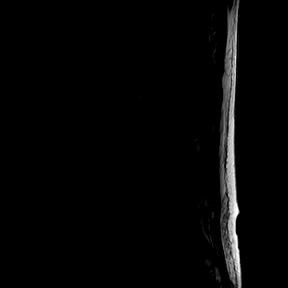

[Series 201: t2w_cor-surv · coronal · 6.0mm · 0.60mm/px · 1 of 5 slices shown]
[im 1/5]
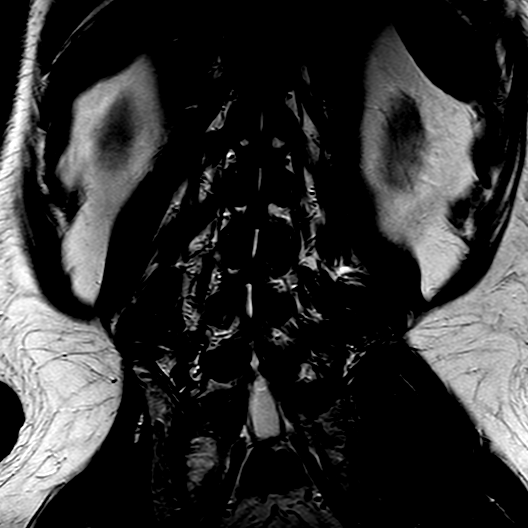

[Series 301: t1_tse_sag · sagittal · 4.0mm · 0.48mm/px · 3 of 17 slices shown]
[im 1/17]
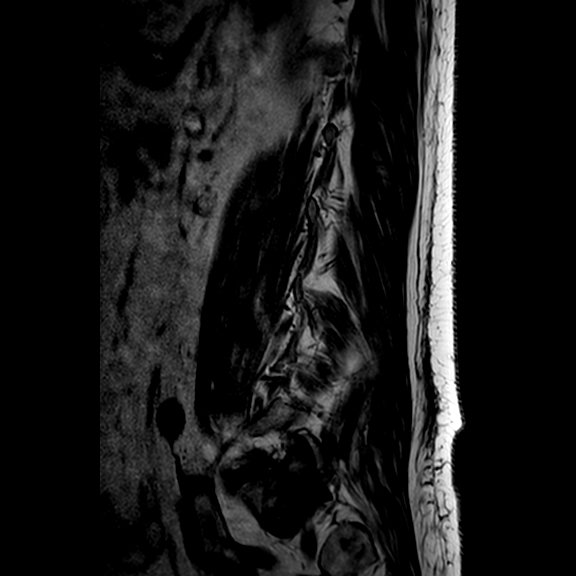
[im 9/17]
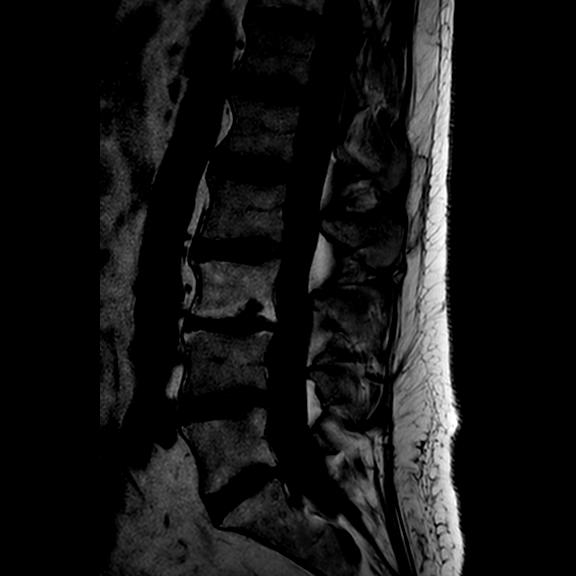
[im 17/17]
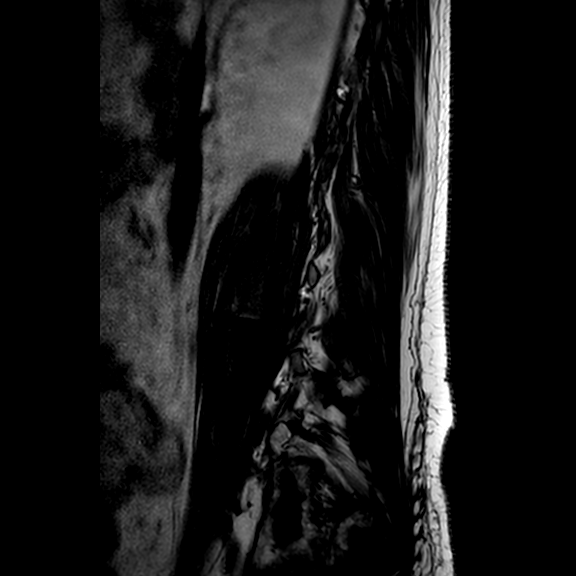

[Series 401: t2_tse_sag · sagittal · 4.0mm · 0.43mm/px · 3 of 17 slices shown]
[im 1/17]
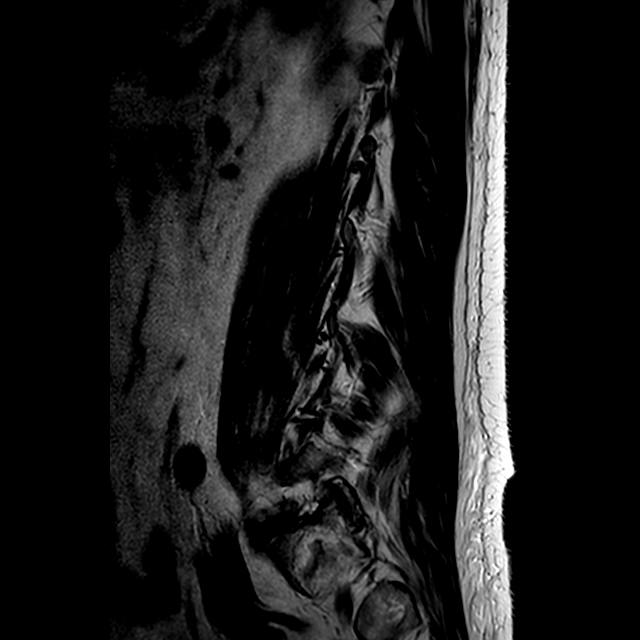
[im 9/17]
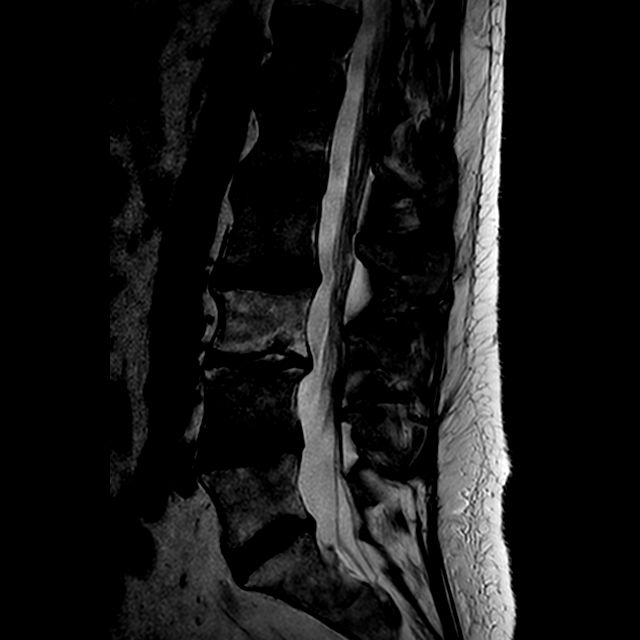
[im 17/17]
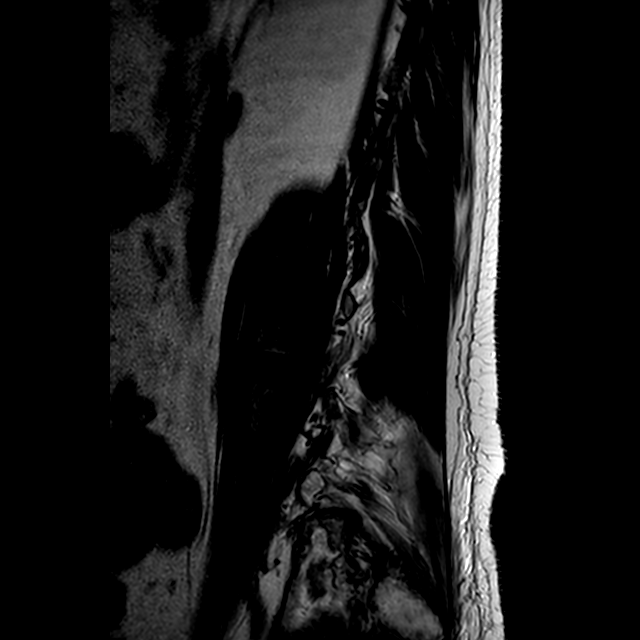

[Series 501: stir_sag · sagittal · 4.0mm · 0.62mm/px · 1 of 17 slices shown]
[im 1/17]
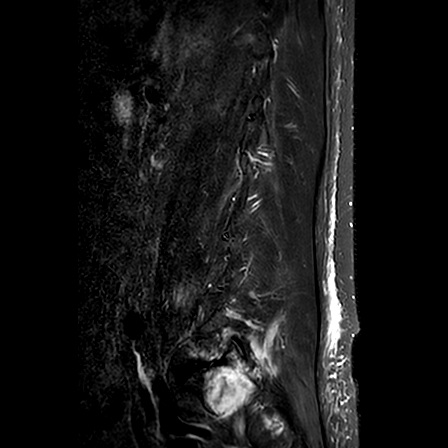

[Series 701: T1 · axial · 4.0mm · 0.38mm/px · z∈[-175,+61]mm · 7 of 35 slices shown]
[im 1/35]
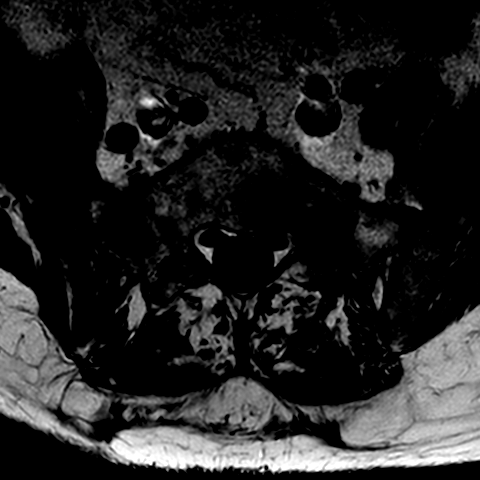
[im 6/35]
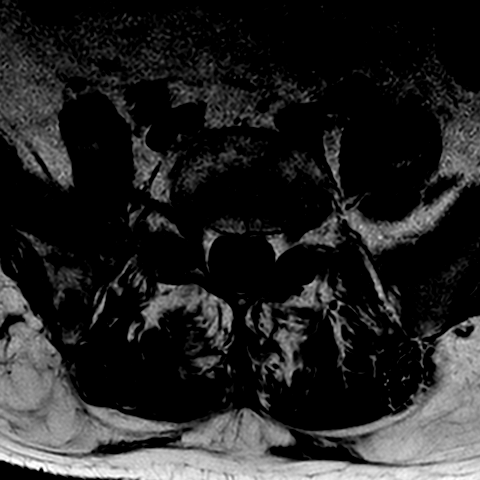
[im 12/35]
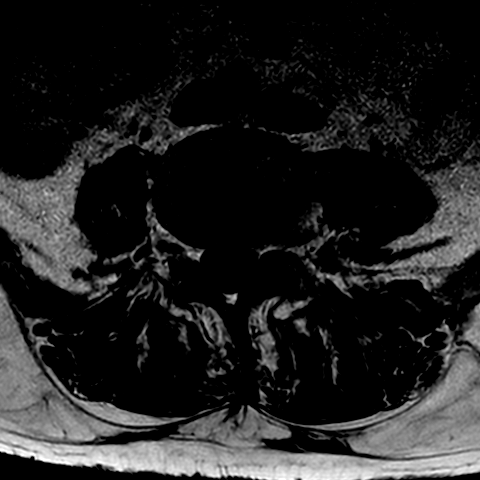
[im 18/35]
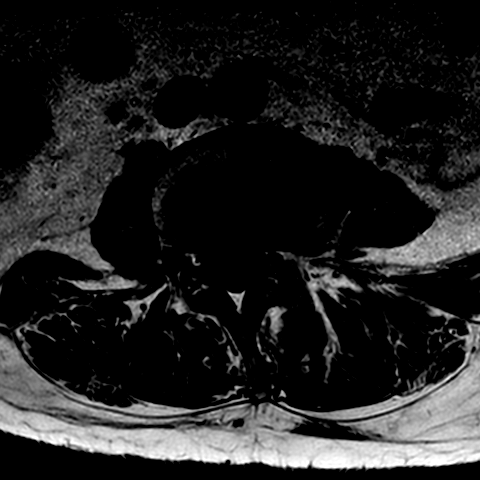
[im 23/35]
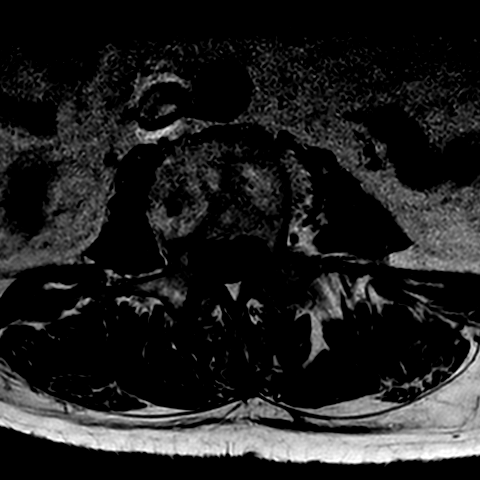
[im 29/35]
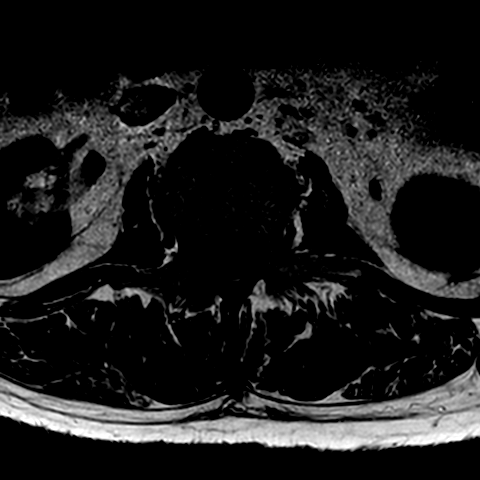
[im 35/35]
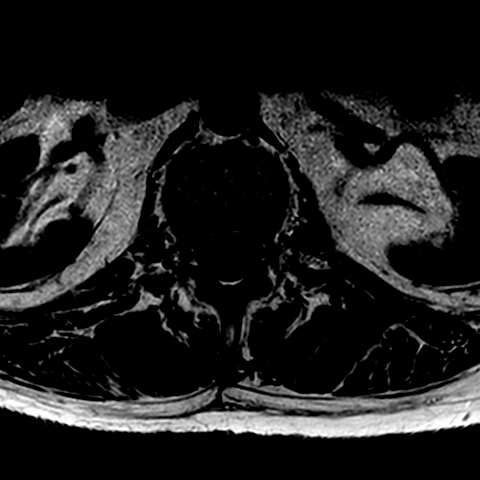

[18 of 48 positions shown; findings below may reference images not displayed]

FINDINGS: Lumbar vertebral heights are intact. There is a Schmorls node along 
the inferior L3 endplate. There is no compression fracture. There are marrow 
signal changes compatible with metastatic involvement, not appearing 
significantly different from the prior study. Signal changes are most pronounced 
at L4, L2, L1 and T12. There is an approximately 4 cm enhancing lesion within 
the left S1 vertebral segment, best seen on fat suppressed sagittal enhanced 
image 3, roughly similar to the T2 signal abnormality seen on the prior 
examination. There is a 5 mm enhancing focus in the L5 vertebral body which is 
similar in size compared to the prior STIR images. 
There are signal changes with enhancement in the right sacral wing and medial 
right iliac bone, similar to the prior MRI. The sacrum and pelvis are not 
completely evaluated. 
Prior MRI showed a 2.6 x 1.9 cm soft tissue mass along the right L3-4 disc 
space. This is markedly reduced in size, currently approximately 0.9 x 0.5 mm, 
which partially includes the ganglion at this level. Previously there was a
x 3.4 cm soft tissue focus in the left paraspinal musculature opposite L2-3, 
also markedly reduced in size, without measurable abnormality on the current 
examination. Soft tissue abnormality in the right L4-5 and L5-S1 paraspinal 
musculature previously extended 5.3 x 3.5 cm, currently markedly reduced in size 
on the T2-weighted images. There is residual enhancement in this region 
extending approximately 2.6 x 2.3 cm. 
There is no pathologic enhancement within the neural canal. The conus is normal. 
At L5-S1 the canal is open. Foramina appear open. 
At L4-5 there is mild left-sided canal stenosis due to disc bulge and 
ligamentous thickening which is unchanged. Foramina remain open. 
At L3-4 there is borderline-mild canal stenosis due to disc bulge and slight 
retrolisthesis, unchanged. Foramina are open. 
At L2-3 and L1-2 the canal and foramina are open. 
Conus tip lies opposite T12-L1. There is mild lumbar dextroscoliosis. Localizer 
shows a benign-appearing 2.6 cm cyst in the upper left kidney.
IMPRESSION: Marked reduction in paraspinal soft tissue masses compared with the prior study. 
No new paraspinal mass. There is no intrathecal neoplastic involvement. 
Stable marrow signal changes consistent with metastatic disease. No new 
fracture. There is enhancement within the S1 vertebral segment corresponding to 
the previously seen T2 hyperintense signal. 5 mm enhancement in the posterior L5 
segment is similar to the previous STIR images. Signal changes in the right 
sacral wing and medial right iliac wing appear similar to the prior study, 
although not completely evaluated. Prior MRI did not include contrast 
administration. 
Todays study shows no evidence for retroperitoneal adenopathy. 
There is no significant lumbar canal stenosis. There is mild lumbar 
dextroscoliosis.

## 2020-10-30 IMAGING — CT PET CT SCAN TUMOR IMAGING SKULL TO THIGH
3 series · 25 of 25 positions shown · non-contrast
Comparison: PET scan and CT scan from July 24, 2020

FINAL Diagnostic Imaging Report 
________________________________________________________________________________________________ 
PET CT SCAN TUMOR IMAGING SKULL TO THIGH, 10/30/2020 [DATE]: 
CLINICAL INDICATION: Right buttocks mass showed aggressive B-cell lymphoma in 
July 2020. Remote history of prostate carcinoma.
TECHNIQUE: A dose of 13.4 millicuries of 18-FDG was administered intravenously 
and skull to thigh PET scanning was performed at 60 minutes. Tomographic scans 
were reconstructed in axial, coronal, and sagittal projections. The data was 
reconstructed into a three-dimensional volume rendered image and reviewed in a 
rotational cine loop. Serum blood glucose at the time of injection was 99 mg/dl.

[Series 201: body-low dose ct, idose (4) · axial · 4.0mm · 1.17mm/px · z∈[-909,+107]mm · 9 of 255 slices shown]
[im 1/255]
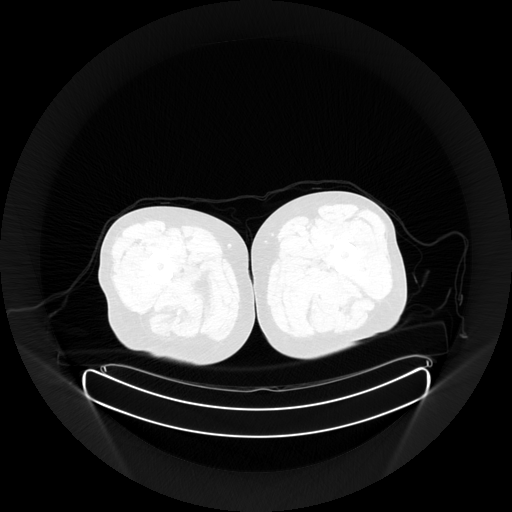
[im 32/255]
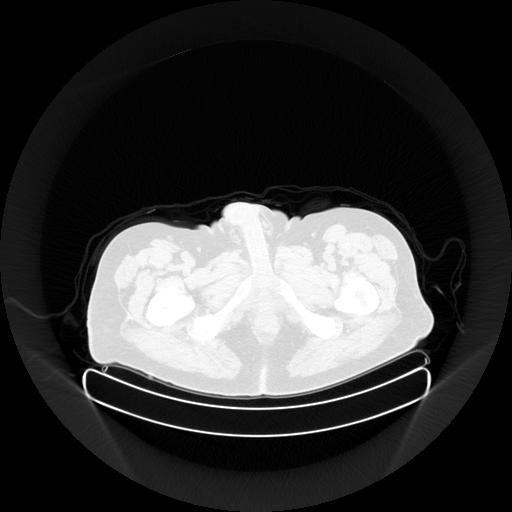
[im 64/255]
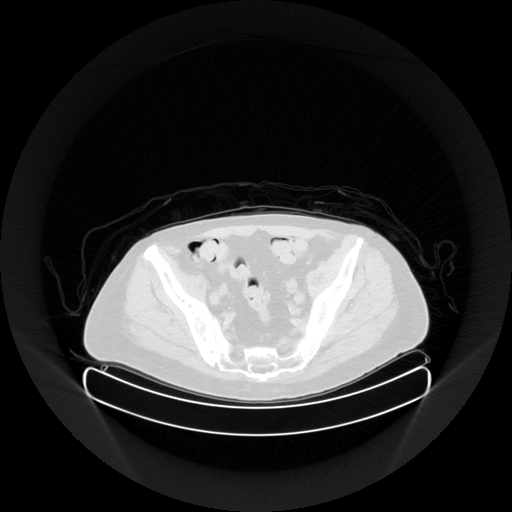
[im 96/255]
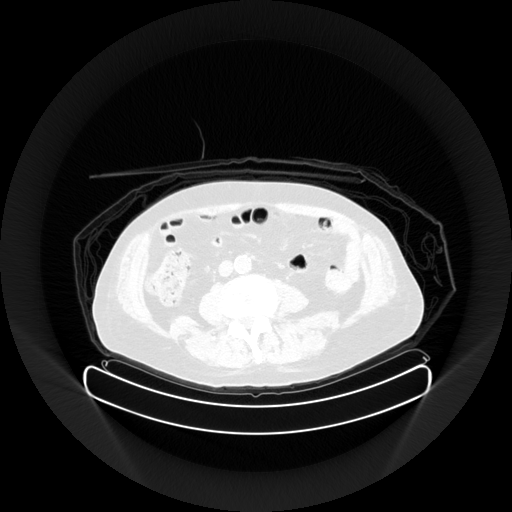
[im 128/255]
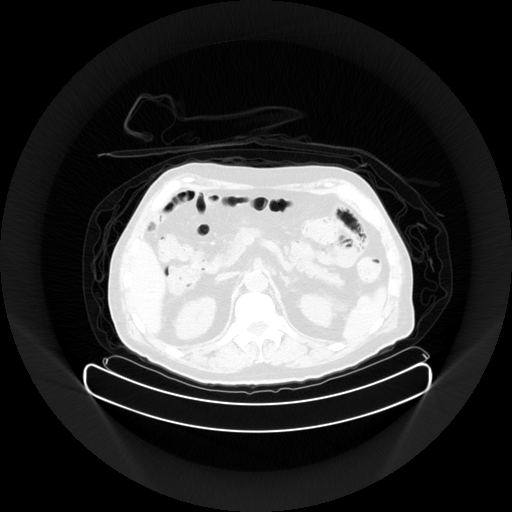
[im 159/255]
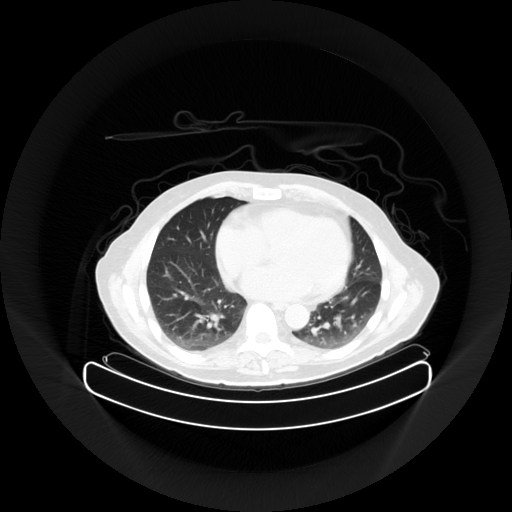
[im 191/255]
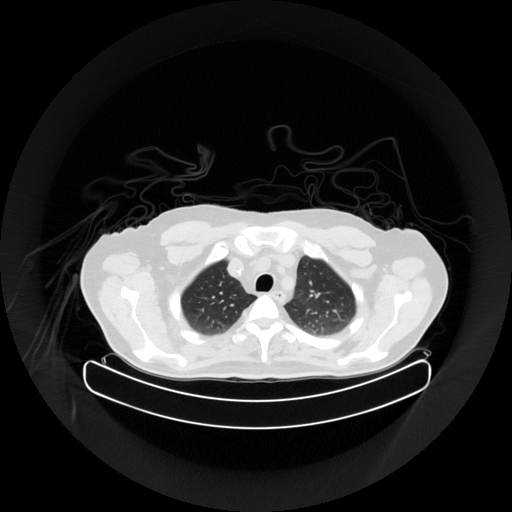
[im 223/255]
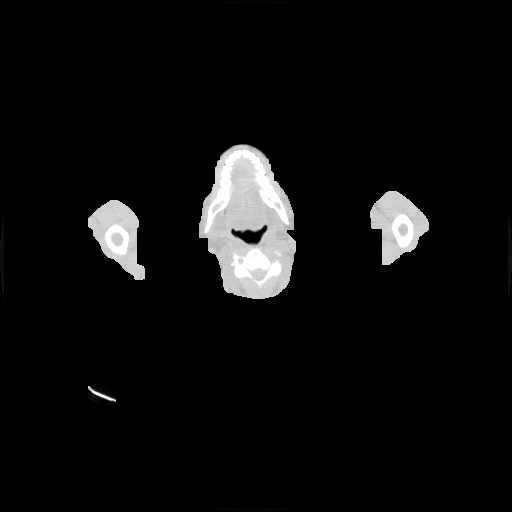
[im 255/255  brain]
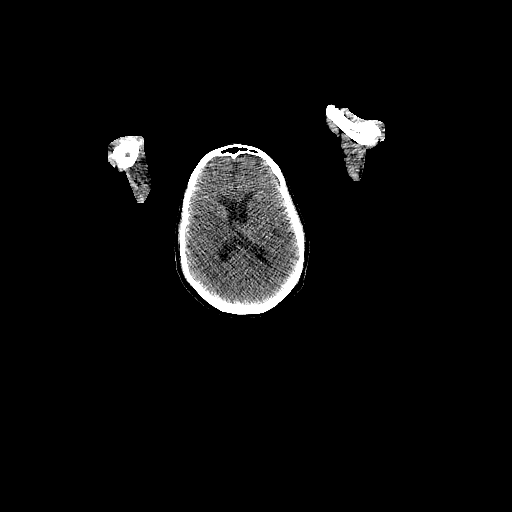

[Series 9546: (wb_nac) body · axial · 4.0mm · 4.00mm/px · z∈[-909,+107]mm · 8 of 255 slices shown]
[im 1/255]
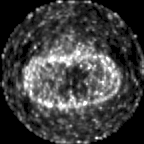
[im 37/255]
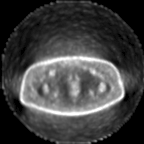
[im 73/255]
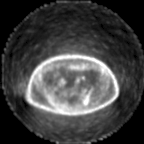
[im 109/255]
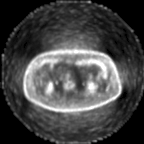
[im 146/255]
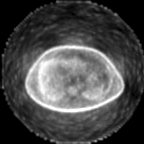
[im 182/255]
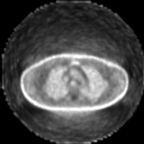
[im 218/255]
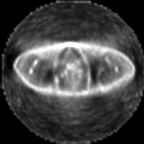
[im 255/255]
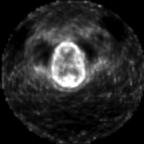

[Series 9548: (wb_ctac) body · axial · 4.0mm · 4.00mm/px · z∈[-909,+107]mm · 8 of 255 slices shown]
[im 1/255]
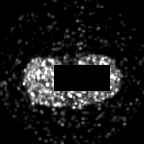
[im 37/255]
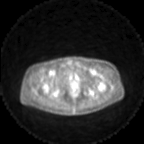
[im 73/255]
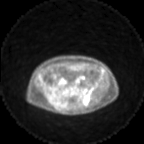
[im 109/255]
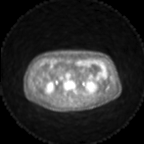
[im 146/255]
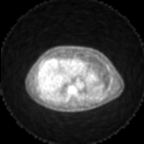
[im 182/255]
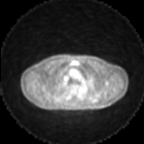
[im 218/255]
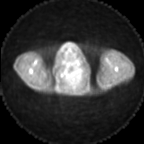
[im 255/255]
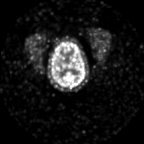

[25 of 25 positions shown; findings below may reference images not displayed]

FINDINGS: NECK/CHEST: Mild linear activity in the right supraclavicular region. Maximum 
SUV 4.0. Question activity within a vein or activity on the skin surface. 
Difficult to say with certainty. Not a typical neoplastic appearance. The 
remainder the activity in the neck and chest is physiologic in distribution. 
ABDOMEN/PELVIS: Faint residual activity overlying the region of the right 
gluteus maximus muscle with a maximum SUV of 1.7. The markedly abnormal activity 
previously seen in the paraspinous musculature and extending down into the right 
gluteus maximus and pelvis has normalized. 
MUSCULOSKELETAL: The right rib lesion and sacral lesion identified previously is 
no longer FDG avid. 
ADDITIONAL CT FUSION FINDINGS: Atherosclerotic changes and degenerative changes. 
Right-sided port.
IMPRESSION: Dramatic improvement if not near complete resolution of the abnormal activity 
seen previously. Mild residual activity seen within the right gluteus maximus 
muscle. Linear focus right supraclavicular not a typical neoplastic pattern. 
Nonspecific. 
Resolution of the osseous lesions.

## 2020-12-27 IMAGING — MR MRI LUMBAR SPINE W/WO CONTRAST
5 of 11 series · 18 of 48 positions shown · IV contrast (gadavist)
Comparison: MRI lumbar spine from October 27, 2020. MRI lumbar spine from July 10, 2020.

________________________________________________________________________________________________ 
MRI LUMBAR SPINE W/WO CONTRAST, 12/27/2020 [DATE]: 
CLINICAL INDICATION: Metastatic disease. History of prostate cancer. Aggressive 
B-cell lymphoma.
TECHNIQUE: Multiplanar, multiecho position MR images of the lumbar spine were 
performed without and with 10 mL of Gadavist injected intravenously. Patient was 
scanned on a 1.5T magnet.

[Series 101: survey · axial · 10.0mm · 1.39mm/px · z∈[-14,+200]mm · 3 of 9 slices shown]
[im 1/9]
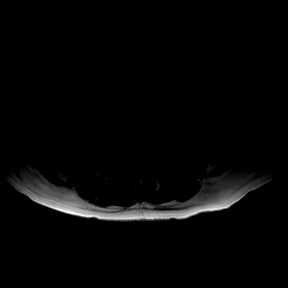
[im 5/9]
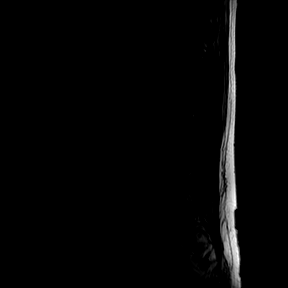
[im 9/9]
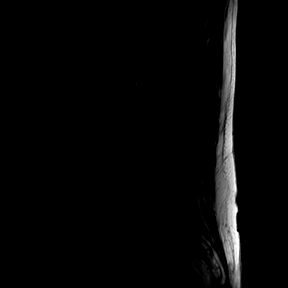

[Series 201: t2w_cor-surv · coronal · 6.0mm · 0.60mm/px · 2 of 5 slices shown]
[im 1/5]
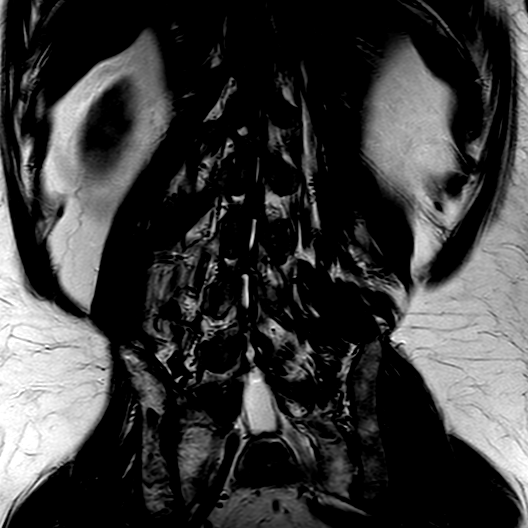
[im 5/5]
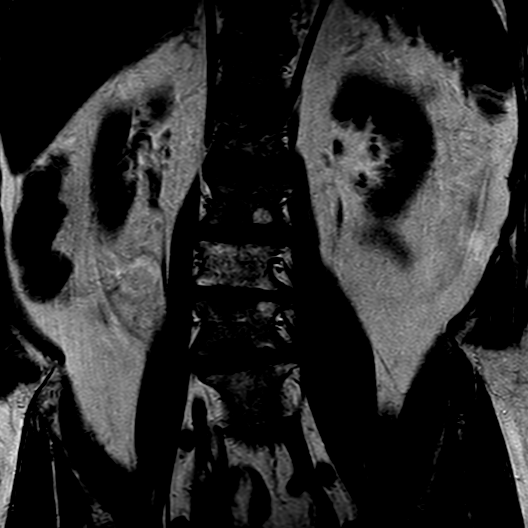

[Series 301: t1_tse_sag · sagittal · 4.0mm · 0.48mm/px · 4 of 17 slices shown]
[im 1/17]
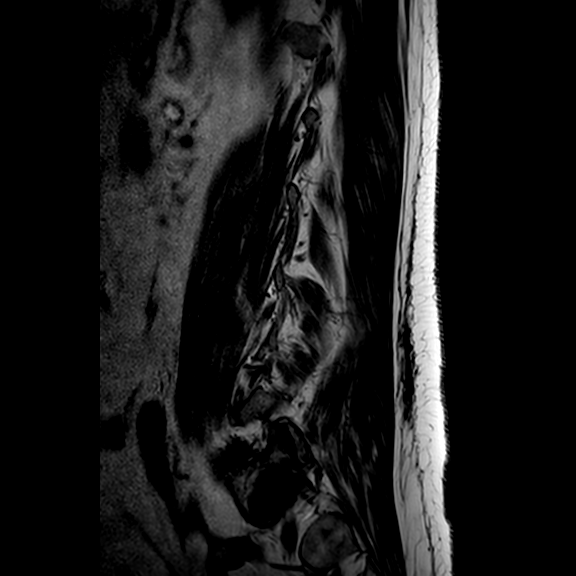
[im 6/17]
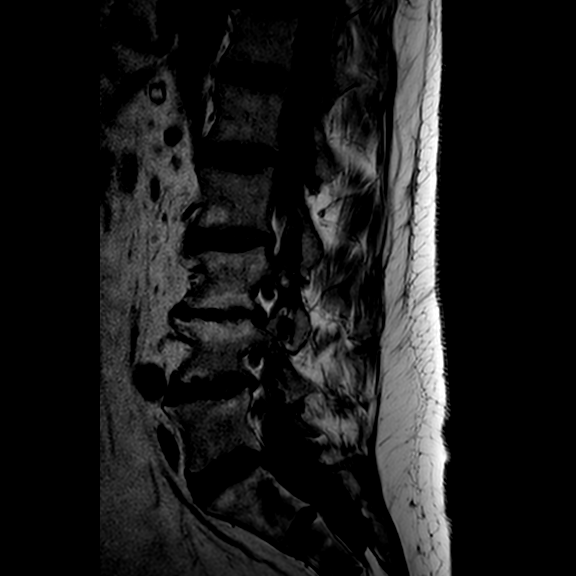
[im 11/17]
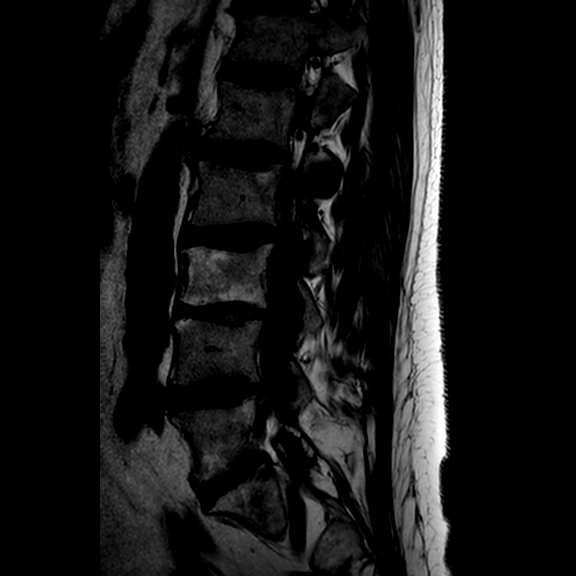
[im 17/17]
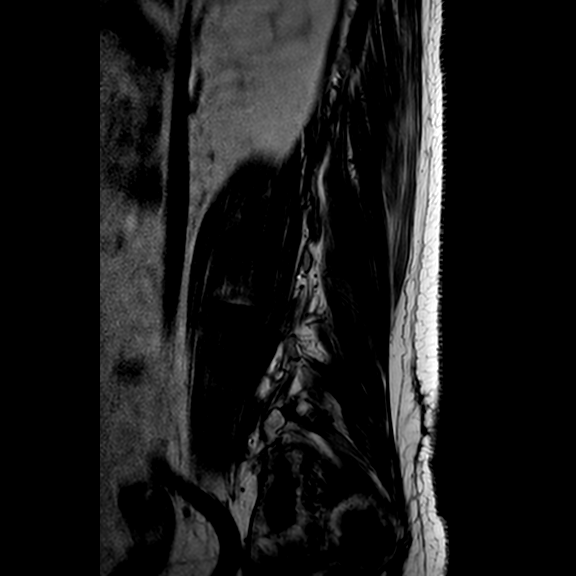

[Series 401: t2_tse_sag · sagittal · 4.0mm · 0.43mm/px · 2 of 17 slices shown]
[im 1/17]
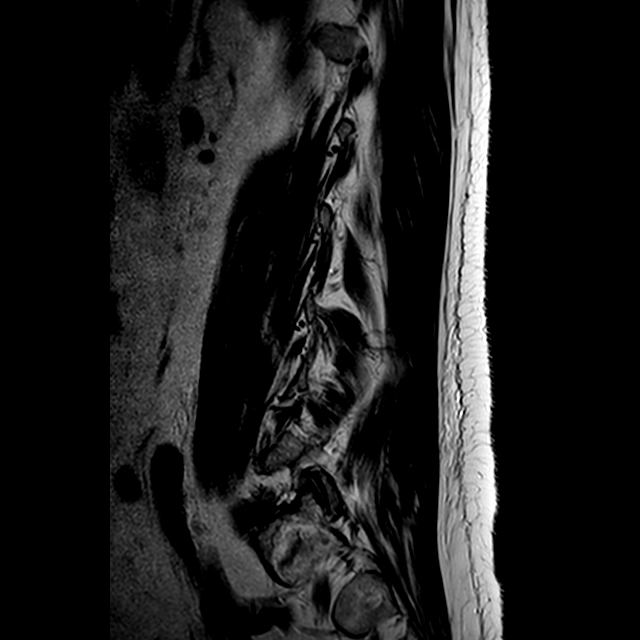
[im 9/17]
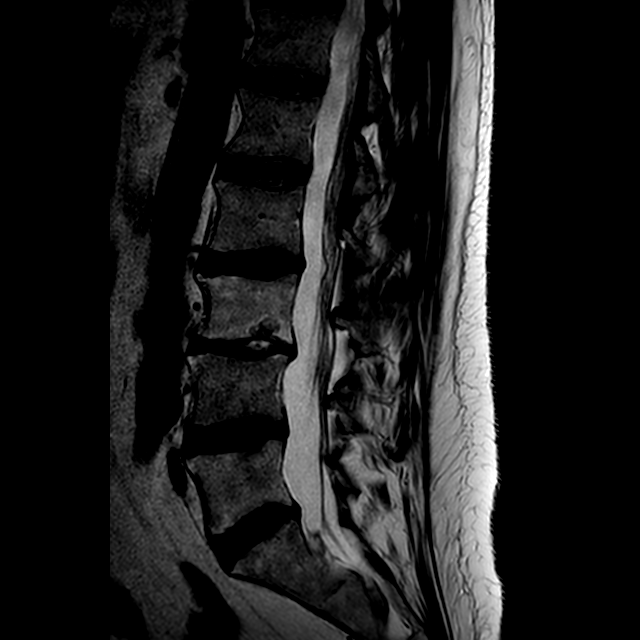

[Series 601: T1 · axial · 4.0mm · 0.38mm/px · z∈[-146,+84]mm · 7 of 35 slices shown]
[im 1/35]
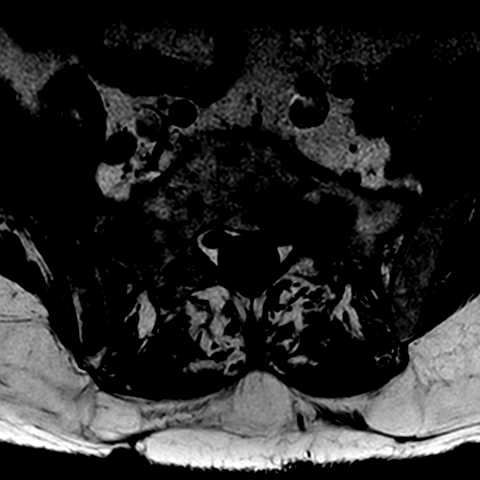
[im 6/35]
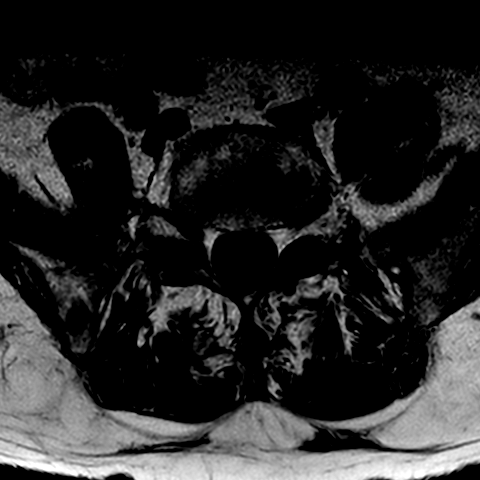
[im 12/35]
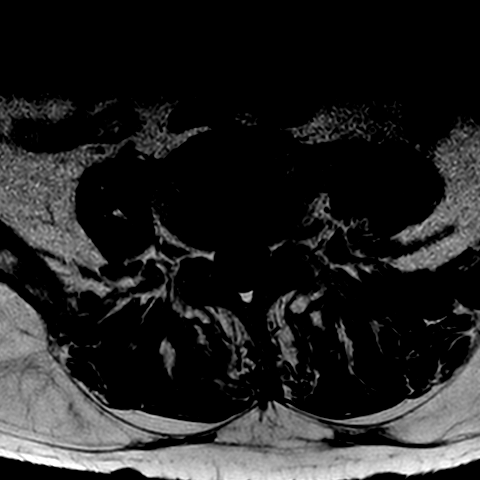
[im 18/35]
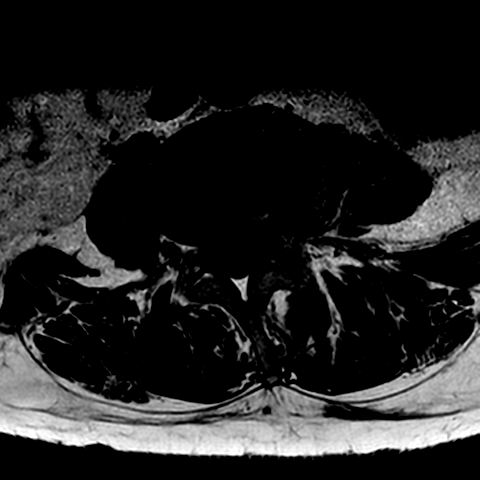
[im 23/35]
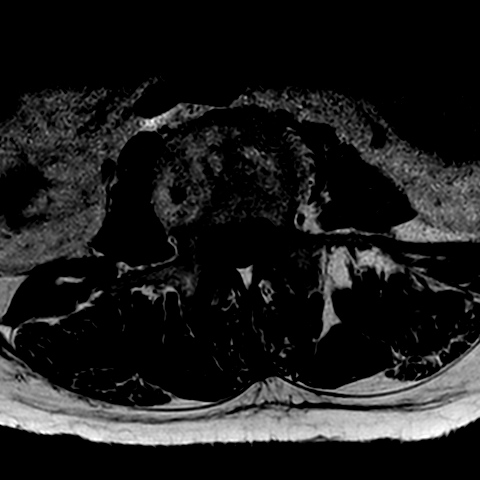
[im 29/35]
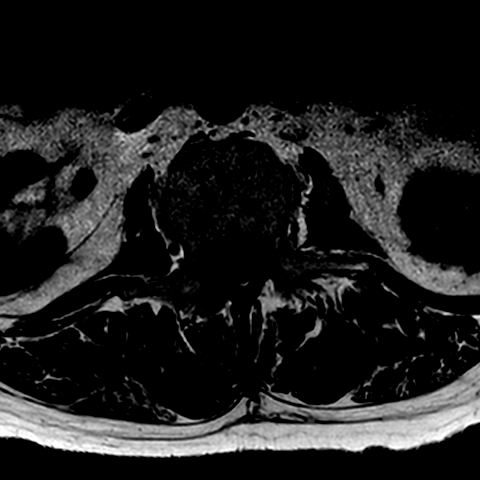
[im 35/35]
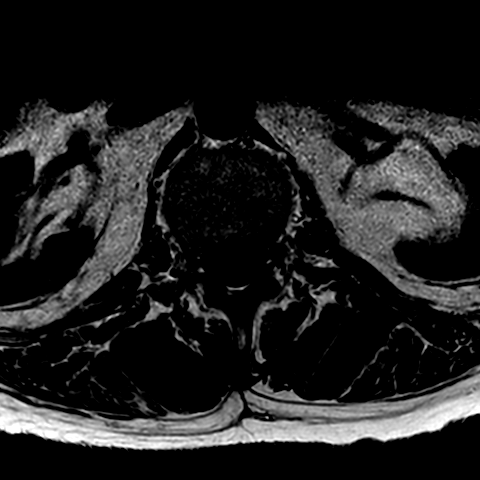

[18 of 48 positions shown; findings below may reference images not displayed]

FINDINGS: --------------------------------------------------------------------------- 
Neoplasm: 
Again seen is abnormal T1 dark, T2 bright, enhancing metastatic lesions within 
the sacrum as well as right iliac wing. Compared to the prior MRI this has 
remained stable. Stable areas of internal necrosis within these lesions. 
There has been improvement in right lower paraspinal muscular enhancement which 
is still persistent. No clearly defined measurable lesion is seen, and the 
enhancement is infiltrative within the musculature. Similarly there is 
infiltrative enhancement adjacent to the left L2 transverse process which has 
improved as well. Improvement in enhancement along the soft tissues just lateral 
to the left L5-S1 facet joint. 
--------------------------------------------------------------------------- 
General: 
Mild rightward curvature of the lumbar spine. Mild grade 1 retrolisthesis of L3 
relative to L4. Conus medullaris is normal in size and signal intensity, 
terminating at L1-L2 level. No intrathecal pathologic enhancement or nodules are 
seen. Small right renal cyst.  
--------------------------------------------------------------------------- 
Segmental: 
T12-L1: No significant central canal or neural foraminal narrowing. 
L1-L2: No significant central canal or neural foraminal narrowing. 
L2-L3: Disc bulge. Left facet hypertrophy. No significant central canal 
narrowing. Mild bilateral subarticular recess narrowing. No significant neural 
foraminal narrowing. 
L3-L4: Loss of disc height on the left side. Disc bulge eccentric to the left 
side with left far lateral endplate osteophytes. Left facet hypertrophy. No 
significant central canal narrowing. No significant right neural foraminal 
narrowing. Mild left neural foraminal narrowing. 
L4-L5: Disc bulge. Left facet hypertrophy. No significant central canal 
narrowing. Mild left subarticular recess narrowing. Mild left neural foraminal 
narrowing. No significant right neural foraminal narrowing. 
L5-S1: Disc bulge. Left subarticular annular fissure. Right facet hypertrophy. 
No significant central canal narrowing. Mild right neural foraminal narrowing. 
No significant left neural foraminal narrowing. 
No change in segmental analysis compared to prior MRI. 
---------------------------------------------------------------------------
IMPRESSION: 1.  Stable neoplastic burden in the sacrum and right iliac bone. 
2.  Improvement in paraspinal soft tissue neoplastic burden.

## 2021-01-02 IMAGING — PT PET CT SCAN TUMOR IMAGING SKULL TO THIGH
3 series · 25 of 25 positions shown · non-contrast
Comparison: 10/30/2020 and 07/24/2020

________________________________________________________________________________________________ 
PET CT SCAN TUMOR IMAGING SKULL TO THIGH, 01/02/2021 [DATE]: 
CLINICAL INDICATION: Diffuse large B-cell lymphoma
TECHNIQUE: A dose of 13.3 millicuries of 18-FDG was administered intravenously 
and skull to thigh PET scanning was performed at 60 minutes. Tomographic scans 
were reconstructed in axial, coronal, and sagittal projections. The data was 
reconstructed into a three-dimensional volume rendered image and reviewed in a 
rotational cine loop. Serum blood glucose at the time of injection was 108 
mg/dl.

[Series 201: body-low dose ct, idose (4) · axial · 4.0mm · 1.17mm/px · z∈[-1157,-141]mm · 9 of 255 slices shown]
[im 1/255]
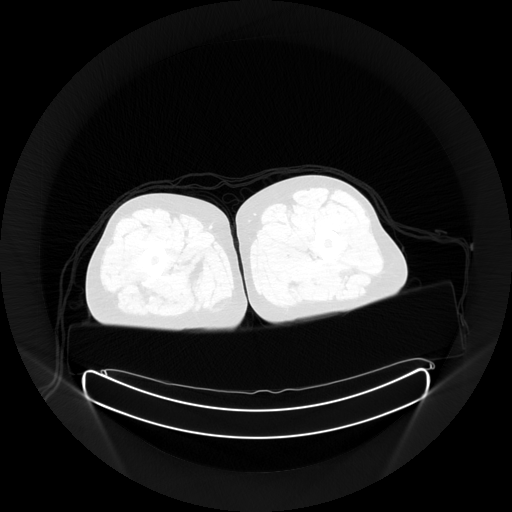
[im 32/255]
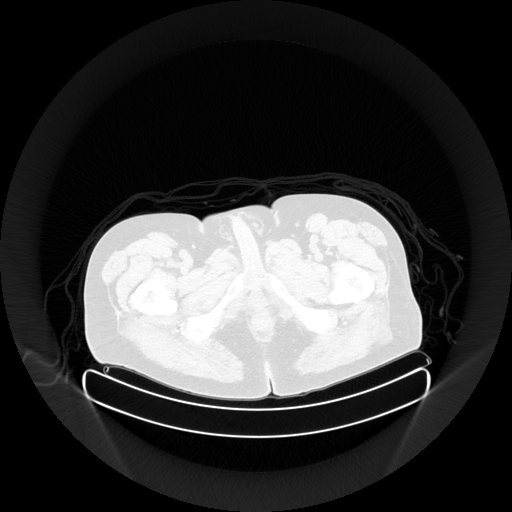
[im 64/255]
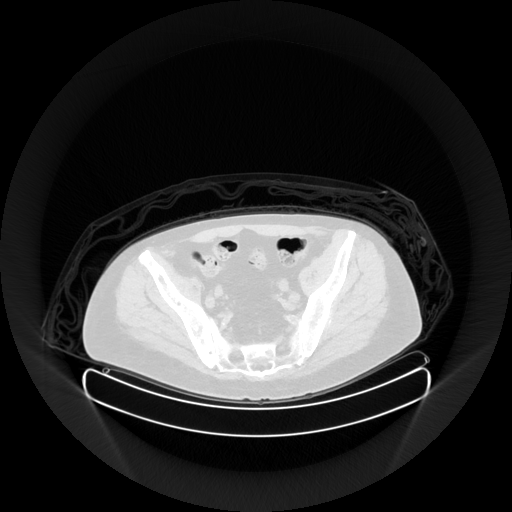
[im 96/255]
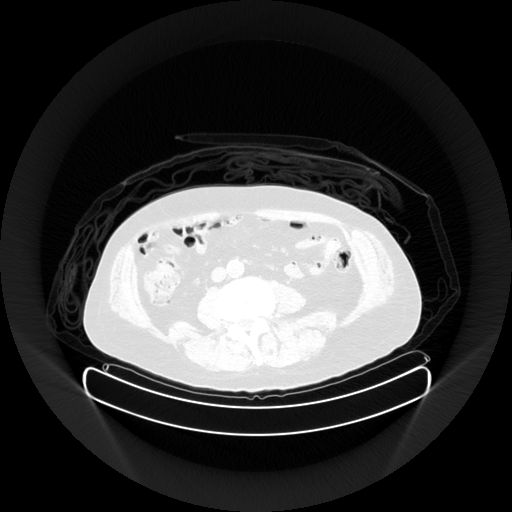
[im 128/255]
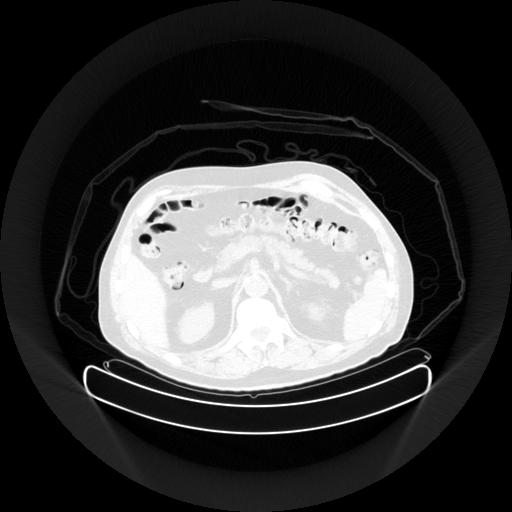
[im 159/255]
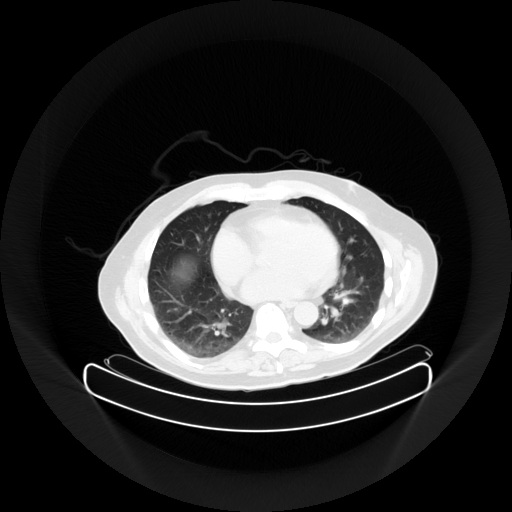
[im 191/255]
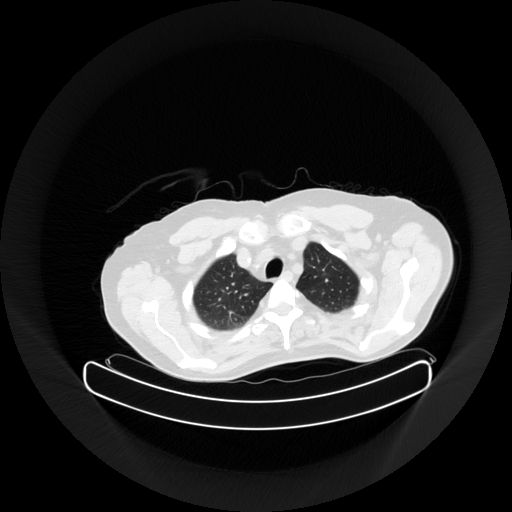
[im 223/255]
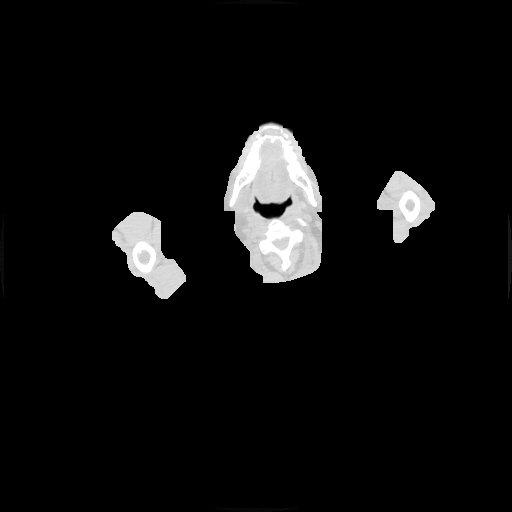
[im 255/255  brain]
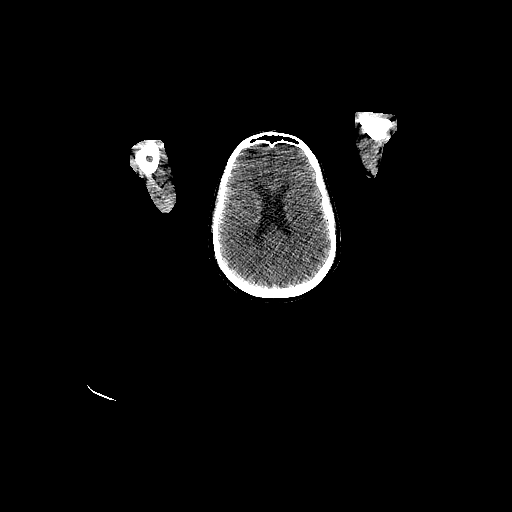

[Series 1515: (wb_nac) body · axial · 4.0mm · 4.00mm/px · z∈[-1157,-141]mm · 8 of 255 slices shown]
[im 1/255]
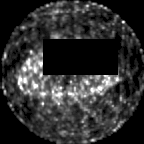
[im 37/255]
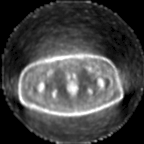
[im 73/255]
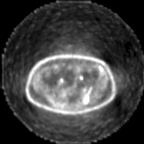
[im 109/255]
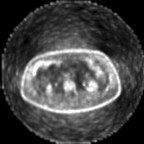
[im 146/255]
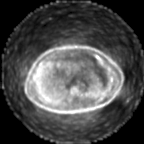
[im 182/255]
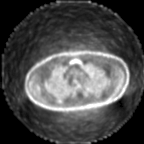
[im 218/255]
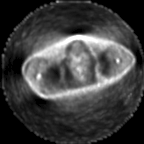
[im 255/255]
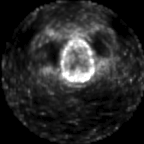

[Series 1517: (wb_ctac) body · axial · 4.0mm · 4.00mm/px · z∈[-1157,-141]mm · 8 of 255 slices shown]
[im 1/255]
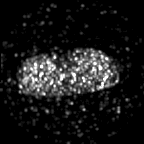
[im 37/255]
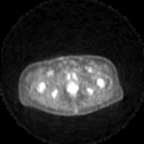
[im 73/255]
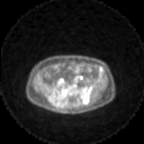
[im 109/255]
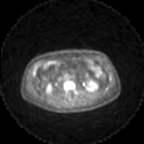
[im 146/255]
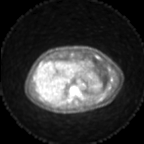
[im 182/255]
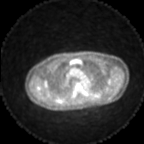
[im 218/255]
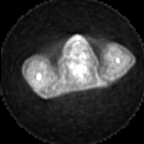
[im 255/255]
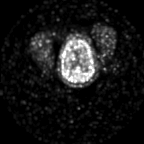

[25 of 25 positions shown; findings below may reference images not displayed]

FINDINGS: NECK/CHEST: No abnormal FDG uptake. 
ABDOMEN/PELVIS: No abnormal FDG uptake. 
MUSCULOSKELETAL: The extensive uptake apparent on the older examination from 
07/24/2020 has resolved. No evidence of abnormal uptake in the right ilium or 
adjacent soft tissues is observed on the current exam. Generalized marrow uptake 
is otherwise apparent suggesting marrow stimulation, but no foci of abnormal 
uptake suggestive of malignancy are currently apparent. The uptake in the right 
posterior fifth rib is no longer apparent. The uptake over the right clavicle is 
no longer exhibited. 
ADDITIONAL CT FUSION FINDINGS: Findings
IMPRESSION: The current examination reveals no evidence of abnormal uptake suggestive of 
malignancy. The extensive abnormalities apparent in the 07/24/2020 examination are 
resolved. The uptake in the marrow is generally increased in a manner likely 
indicative of marrow stimulation.

## 2021-03-08 IMAGING — DX LUMBAR SPINE COMPLETE 4 VIEWS
1 series · 4 of 4 positions shown · non-contrast
Comparison: Lumbar radiograph June 27, 2020

________________________________________________________________________________________________ 
LUMBAR SPINE COMPLETE 4 VIEWS, 03/08/2021 [DATE]: 
CLINICAL INDICATION:  Right leg pain, radiculopathy

[Series 1: AP · U · 0.14mm/px · 4 of 4 slices shown]
[im 1/4]
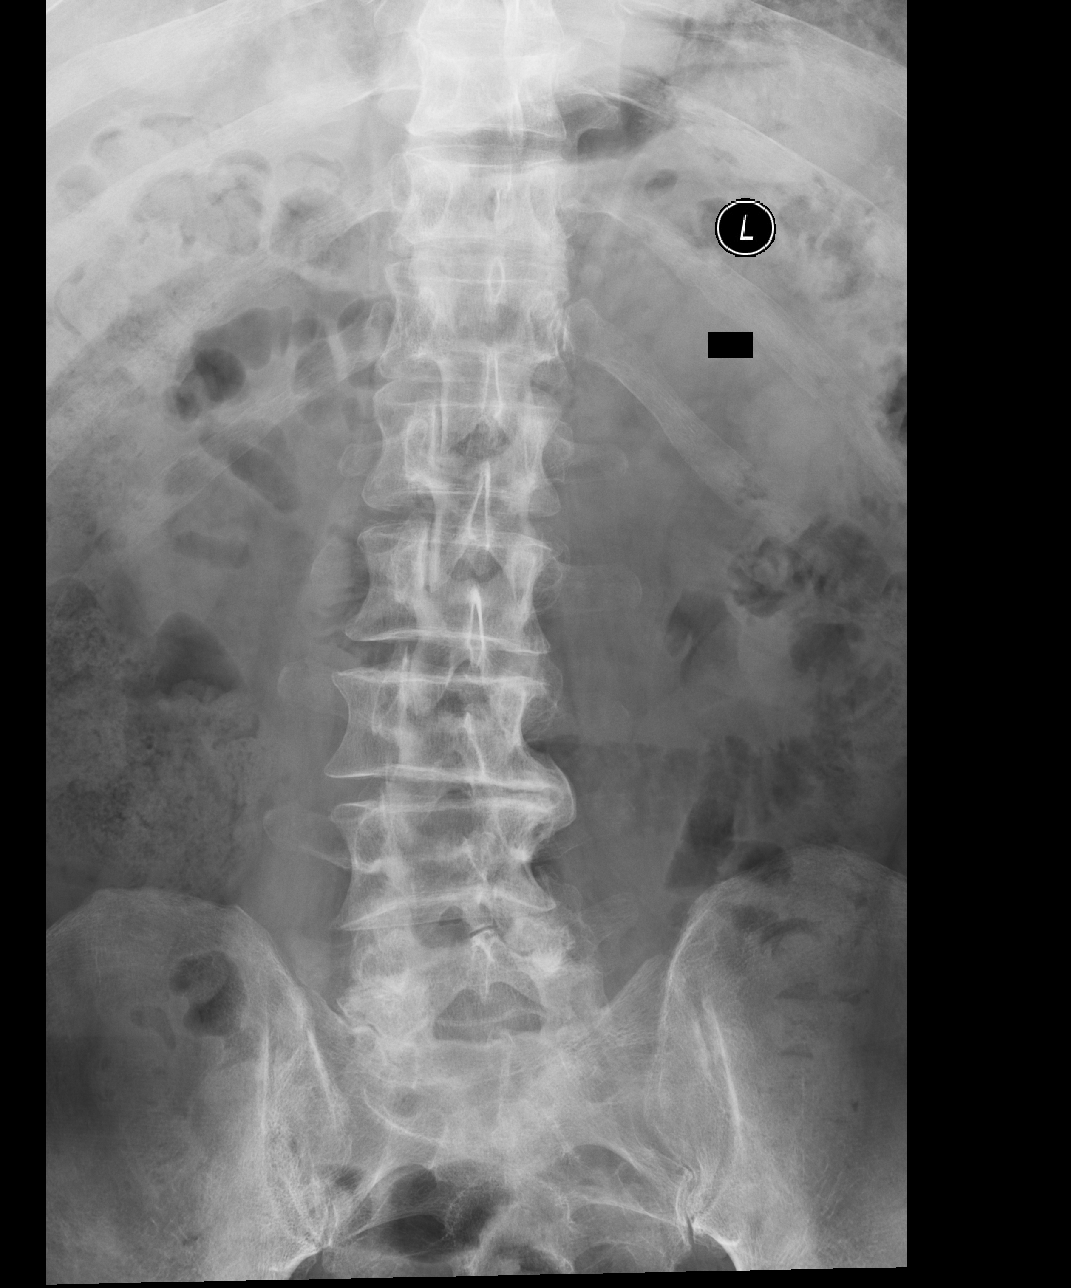
[im 2/4]
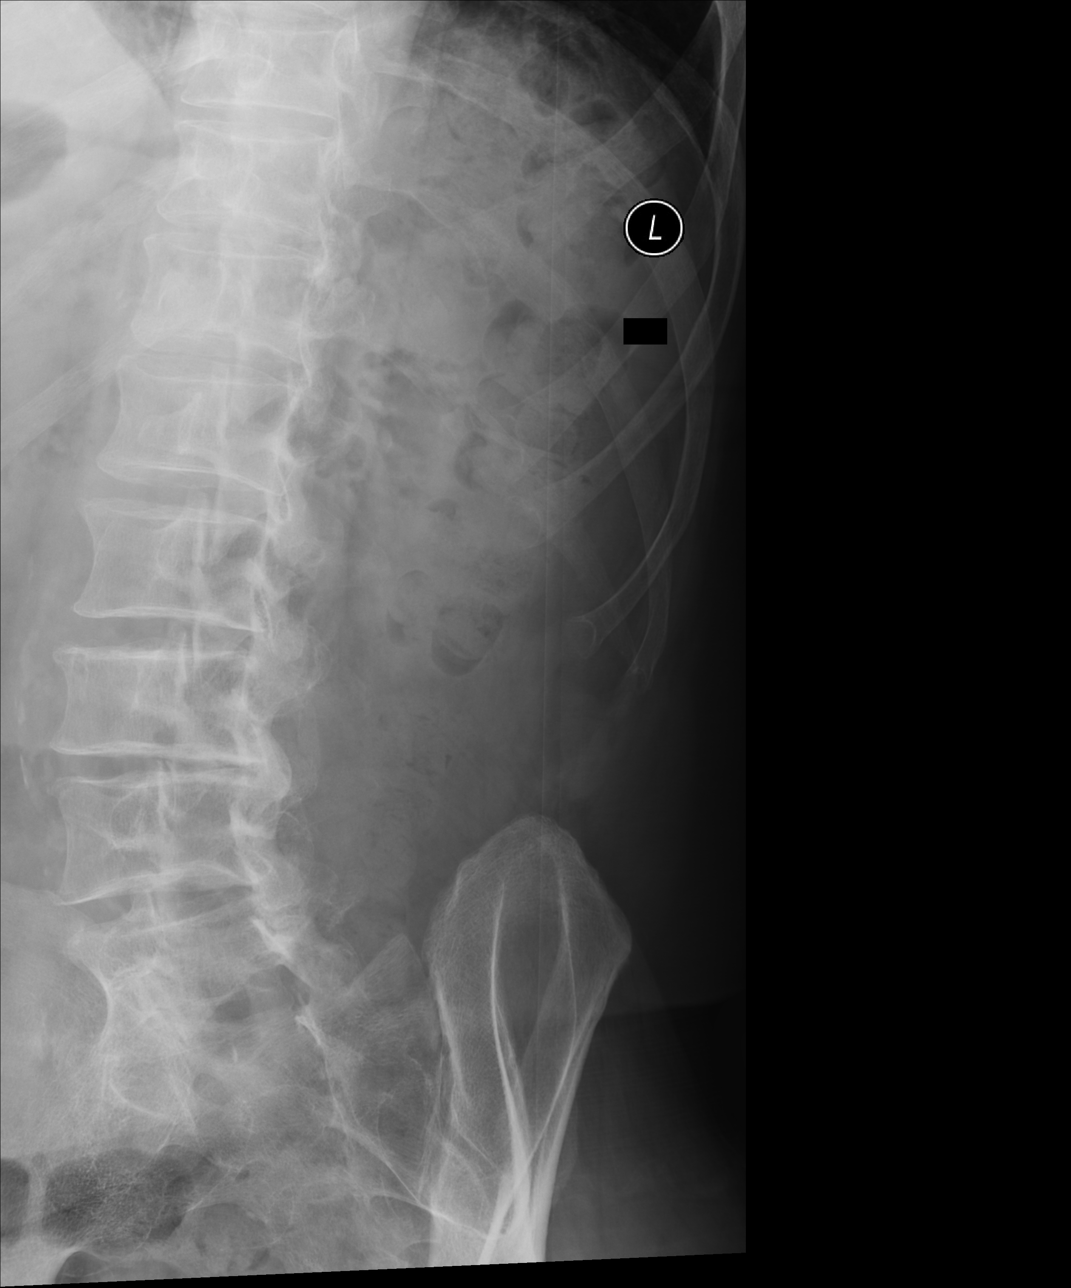
[im 3/4]
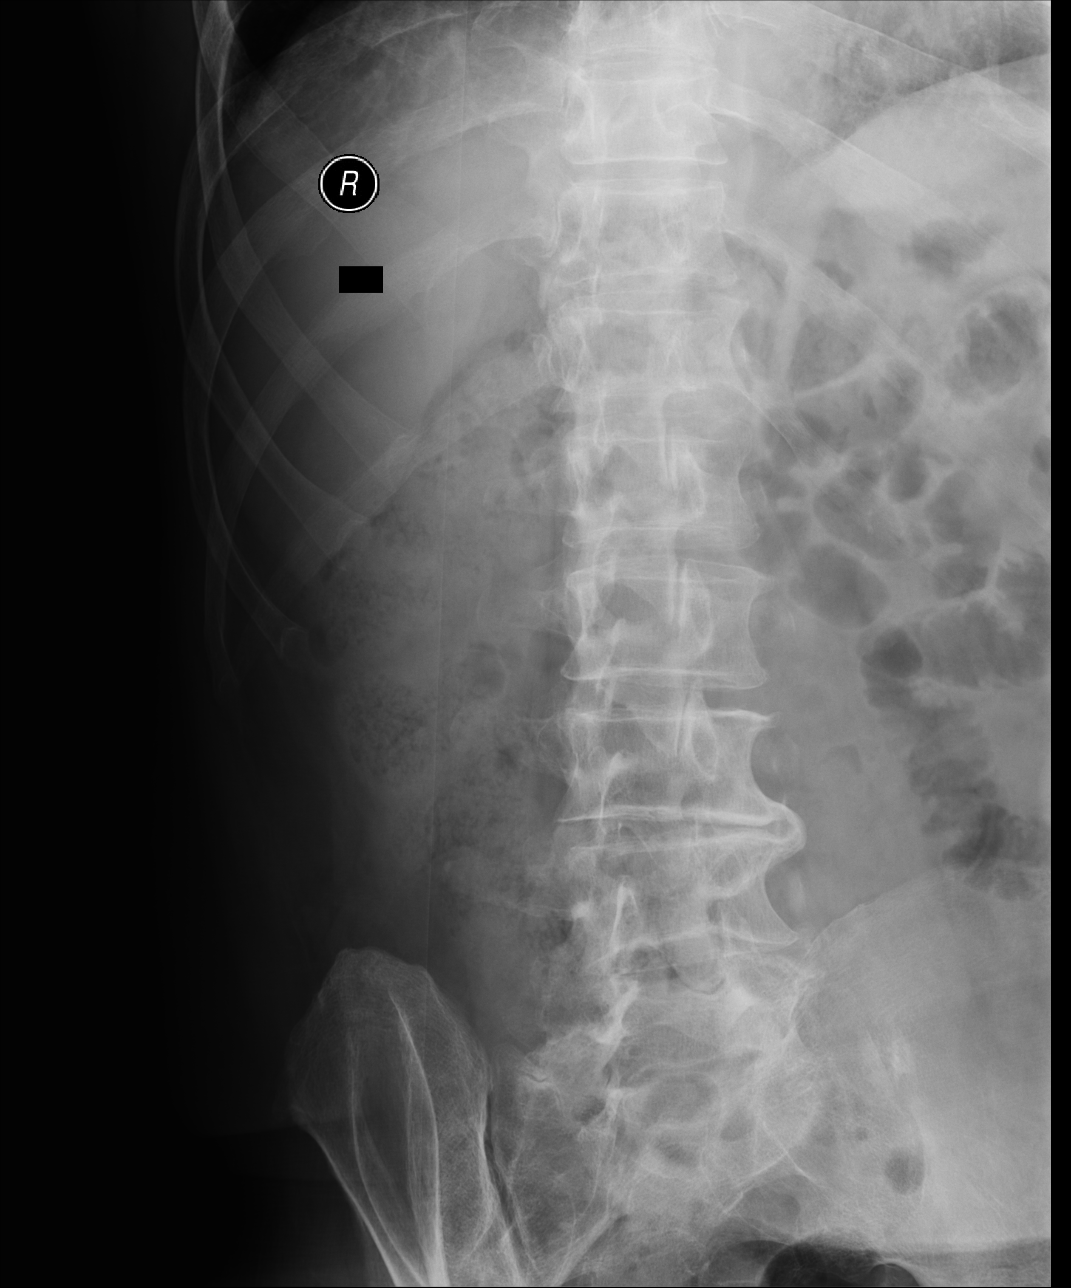
[im 4/4]
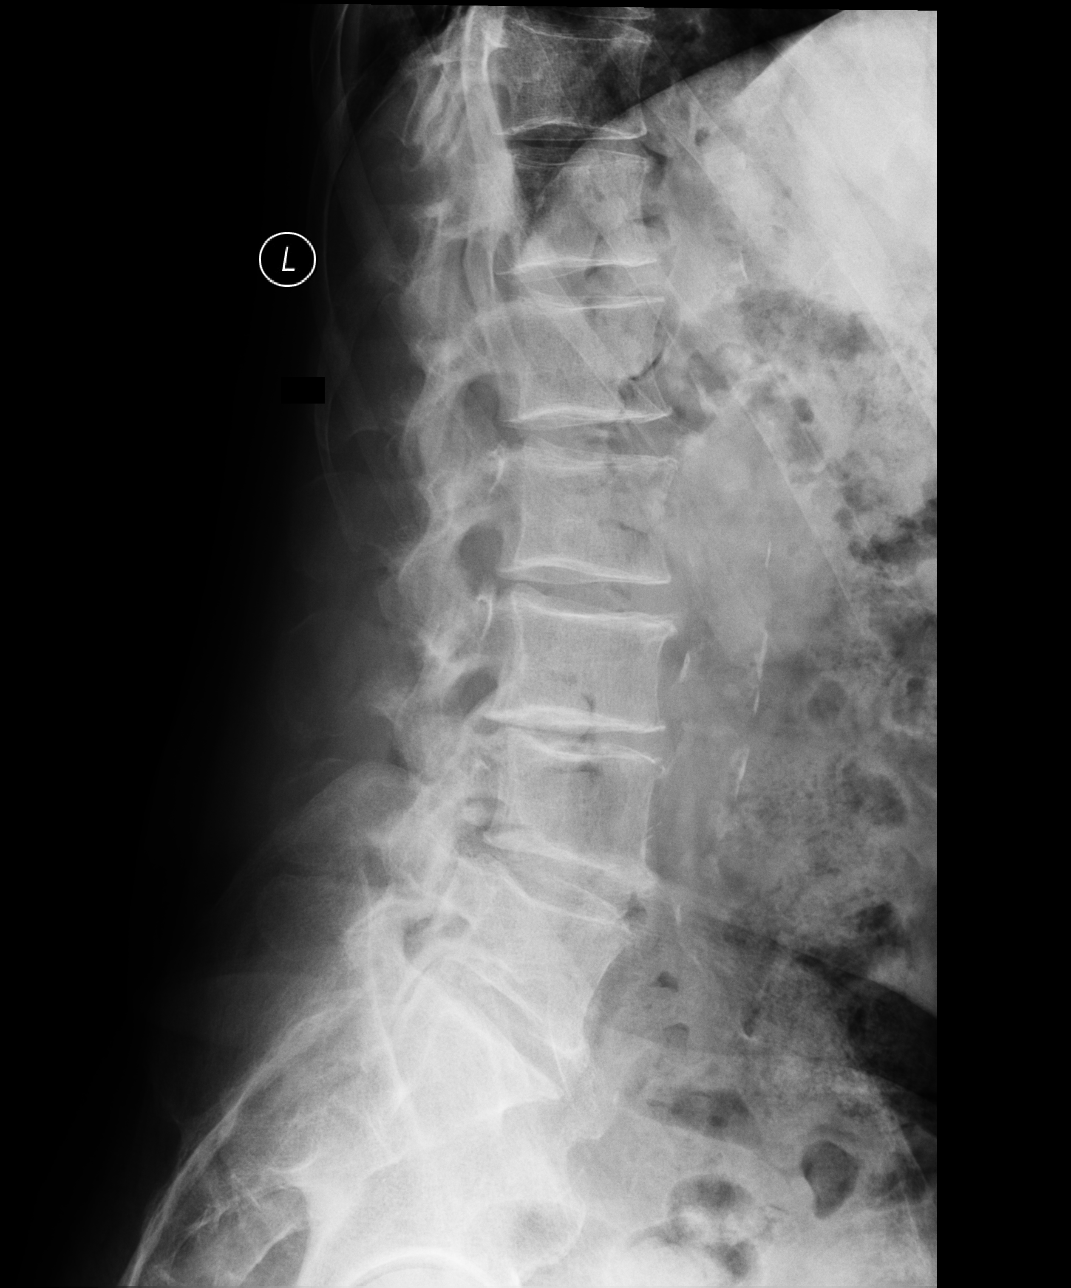

[4 of 4 positions shown; findings below may reference images not displayed]

FINDINGS: Lumbar vertebral heights are intact. There is mild loss of height 
along the upper L5 endplate which appears unchanged, most likely chronic, 
degenerative. There is marked disc narrowing at L3-4, unchanged. Endplate 
sclerosis and mild osteophyte appears stable. Oblique views show no 
spondylolysis. There is no listhesis. There is mild lumbar dextrocurvature. 
Facet changes are most pronounced at the lower 2 lumbar levels. There are mild 
to moderate SI joint degenerative changes. .
IMPRESSION: Degenerative changes, not significantly different from the prior examination. No 
bone destruction identified. Patient has known pelvic and paraspinal neoplastic 
lesions. If indicated further follow-up with enhanced MRI would be useful.

## 2021-04-13 IMAGING — MR MRI LUMBAR SPINE WITHOUT CONTRAST
4 of 6 series · 14 of 48 positions shown · IV contrast (gadolinium)
Comparison: 12/27/2020 MRI lumbar spine

________________________________________________________________________________________________ 
MRI LUMBAR SPINE WITHOUT CONTRAST, 04/13/2021 [DATE]: 
CLINICAL INDICATION: Low back pain. Right-sided sciatic pain. Non-Hodgkin 
lymphoma.
TECHNIQUE: Multiplanar, multiecho position MR images of the lumbar spine were 
performed without intravenous gadolinium enhancement. Patient was scanned on a 
3T magnet.

[Series 201: survey · axial · 10.0mm · 1.39mm/px · z∈[-15,+199]mm · 5 of 9 slices shown]
[im 1/9]
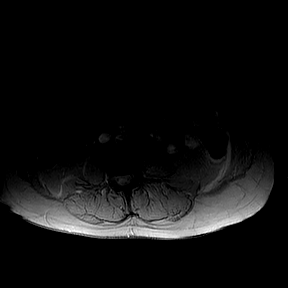
[im 3/9]
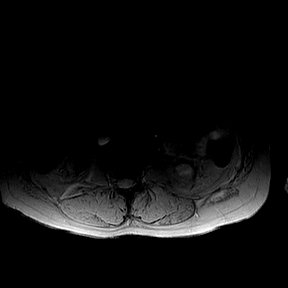
[im 5/9]
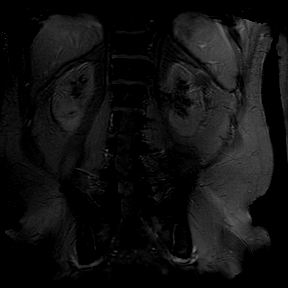
[im 7/9]
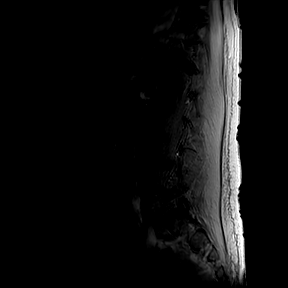
[im 9/9]
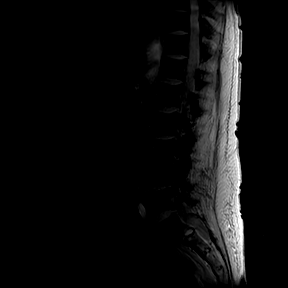

[Series 301: t2w_cor-surv · coronal · 6.0mm · 0.50mm/px · 2 of 5 slices shown]
[im 1/5]
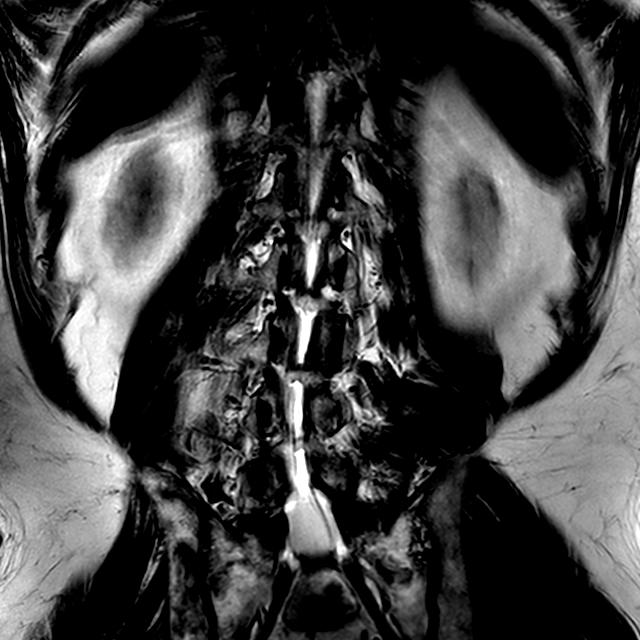
[im 5/5]
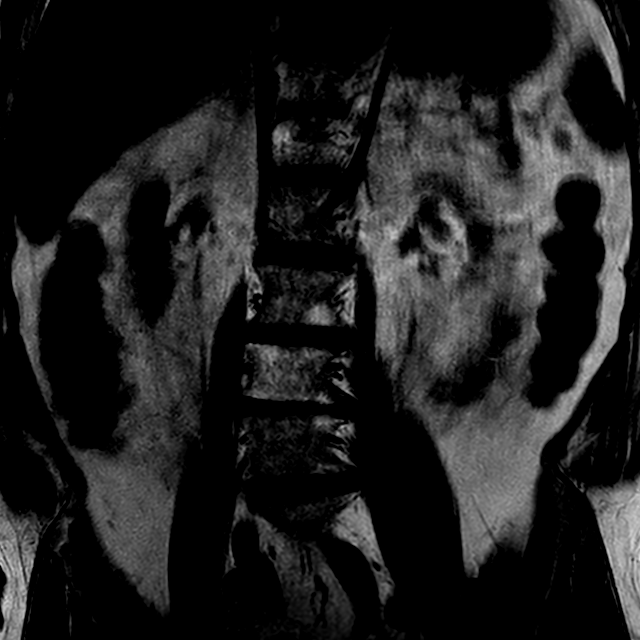

[Series 401: t2w_tse sag · sagittal · 4.0mm · 0.35mm/px · 4 of 19 slices shown]
[im 1/19]
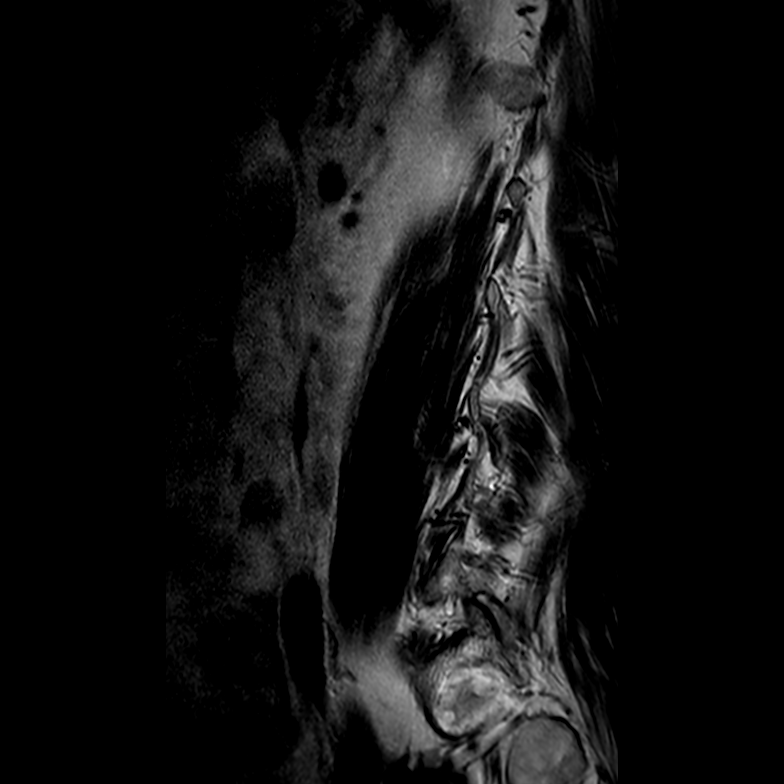
[im 3/19]
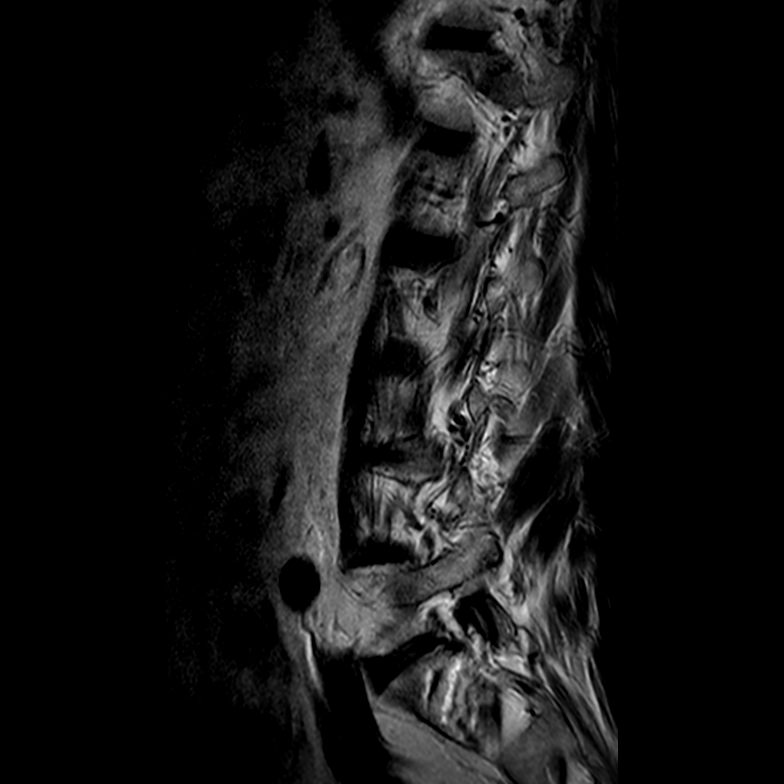
[im 10/19]
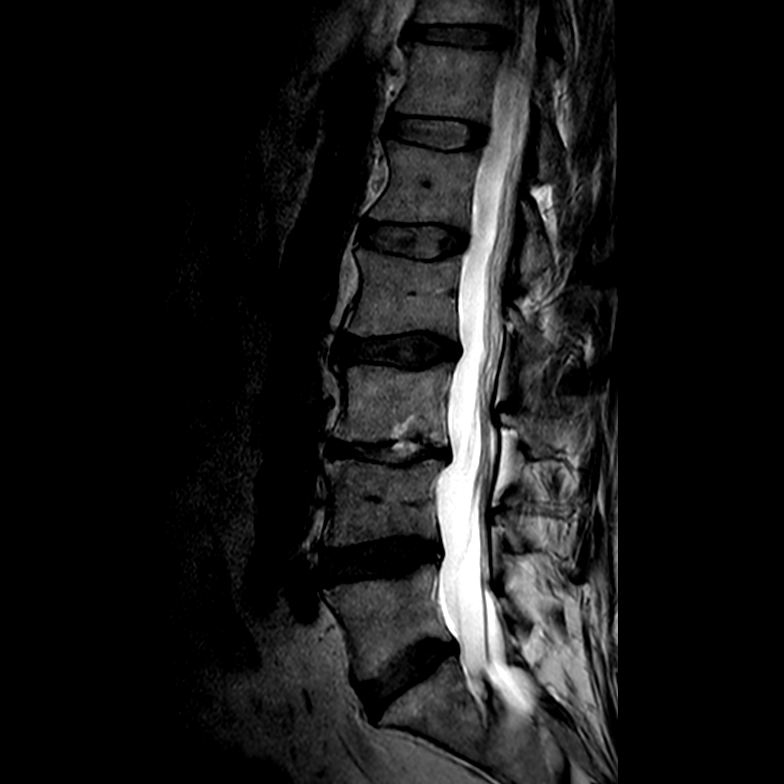
[im 16/19]
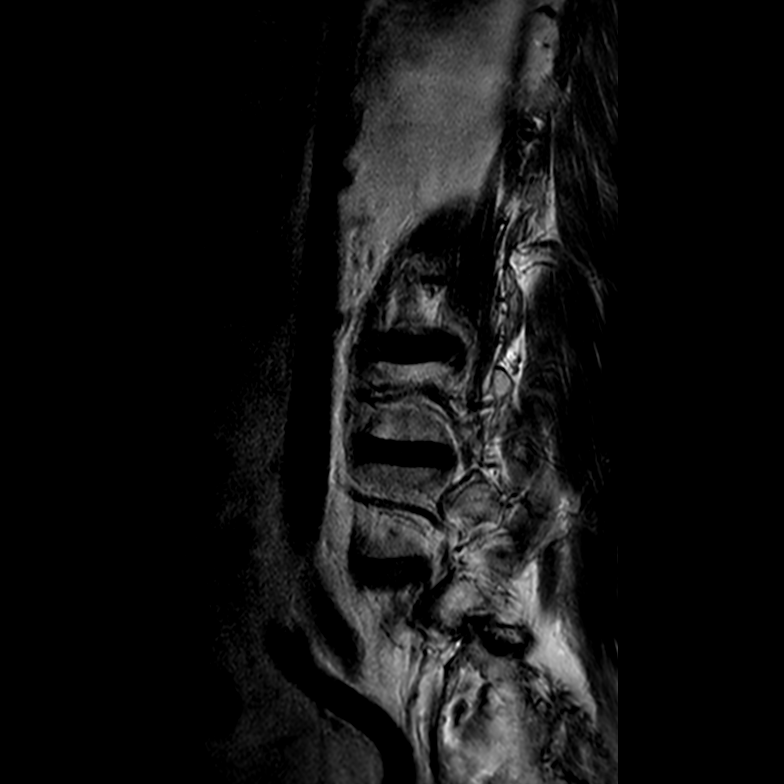

[Series 501: t1w_tse sag · sagittal · 4.0mm · 0.54mm/px · 3 of 19 slices shown]
[im 3/19]
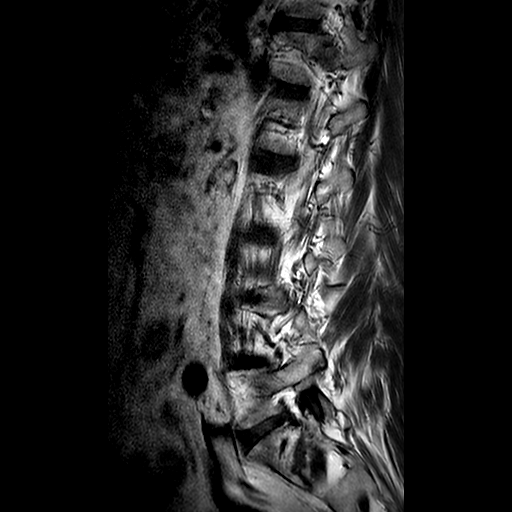
[im 10/19]
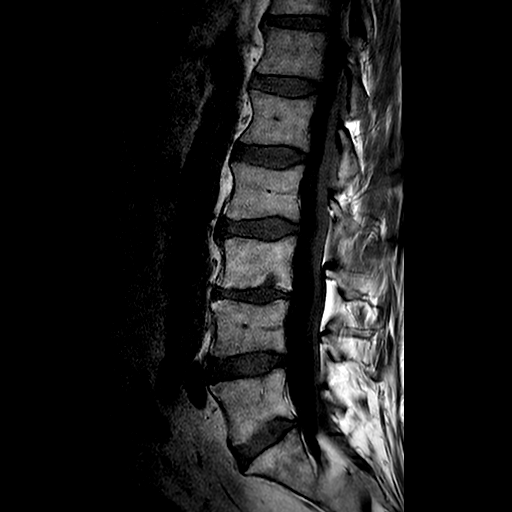
[im 16/19]
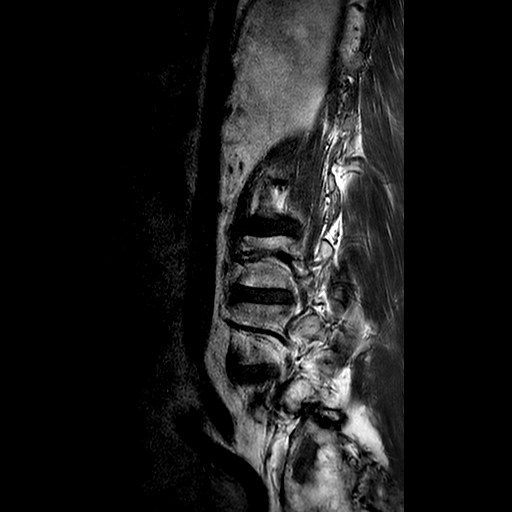

[14 of 48 positions shown; findings below may reference images not displayed]

FINDINGS: Nomenclature is based on 5 lumbar type vertebral bodies. The vertebral 
bodies are normal in height and alignment. No acute vertebral body fracture. 
Multifocal degenerative change and 12 degrees dextroscoliosis. L3 inferior 
endplate Schmorl node. Stable 3 mm retrolisthesis at L3-4. No pars defect. The 
conus tip terminates at the L1 vertebral body level.  
Modic I-II: Type II Modic changes at L2-4 
Ligamentum Flavum > 2.5 mm: All levels 
T12-L1: The disc is normal in height and signal. No disc herniation. Normal 
facets. No spinal canal or neural foraminal stenosis. 
L1-L2: The disc is normal in height and signal. No disc herniation. Mild facet 
arthropathy. No spinal canal or neural foraminal stenosis. 
L2-L3: Mild disc space narrowing and disc desiccation. No disc herniation. Mild 
facet arthropathy, greater on the left. Mild lateral recess stenosis. No central 
canal or neural foraminal stenosis. 
L3-L4: Stable 3 mm retrolisthesis. Left-sided osteophyte-disc complex and 
moderate asymmetric disc space narrowing. Disc heterogeneity with central T2 
hyperintensity. Mild facet arthropathy. No central canal stenosis. Mild lateral 
recess/left neural foraminal stenosis. 
L4-L5: Small central annular tear. Mild disc bulge, mild disc space narrowing 
and disc desiccation. No disc herniation. Mild facet arthropathy, greater on the 
left. No spinal canal. Mild left neural foraminal stenosis. 
L5-S1: The disc is normal in height with disc desiccation. No disc herniation. 
Moderate right and mild left facet arthropathy. No central canal stenosis. Mild 
lateral recess/neural foraminal stenosis. 
OTHER: Stable multifocal well-circumscribed marrow replacing lesions in the 
sacrum and right posteromedial iliac bone, with the largest lesion measuring
cm in the left S1 body: Findings are consistent with treated lymphoma versus 
metastases. Near complete resolution of the paraspinal muscular edema-like 
signal changes. Note made of previous paraspinal muscular infiltrative 
enhancement on prior study. Renal cysts. The aorta is normal in diameter.
IMPRESSION: 1.  Stable multifocal well-circumscribed marrow replacing lesions in the sacrum 
and right posteromedial iliac bone, consistent with treated lymphoma versus 
metastases.  
2.  Near complete resolution of the paraspinal muscular edema-like signal 
changes.  
3.  Stable multifocal degenerative change and mild dextroscoliosis.

## 2021-07-26 IMAGING — CT PET CT SCAN TUMOR IMAGING SKULL TO THIGH
3 series · 25 of 25 positions shown · non-contrast
Comparison: none

________________________________________________________________________________________________ 
PET CT SCAN TUMOR IMAGING SKULL TO THIGH, 07/26/2021 [DATE]: 
CLINICAL INDICATION:  Diffuse large B cell lymphoma.
TECHNIQUE: A dose of 12.1 millicuries of 18-FDG was administered intravenously 
and skull to thigh PET scanning was performed at 60 minutes. Tomographic scans 
were reconstructed in axial, coronal, and sagittal projections. The data was 
reconstructed into a three-dimensional volume rendered images and reviewed in a 
rotational cine loop. Serum blood glucose at the time of injection was 95 mg/dl. 
COMPARISON EXAMINATION:  PET/CT January 02, 2021.

[Series 201: body-low dose ct, idose (4) · axial · 4.0mm · 1.17mm/px · z∈[-1232,-216]mm · 9 of 255 slices shown]
[im 1/255]
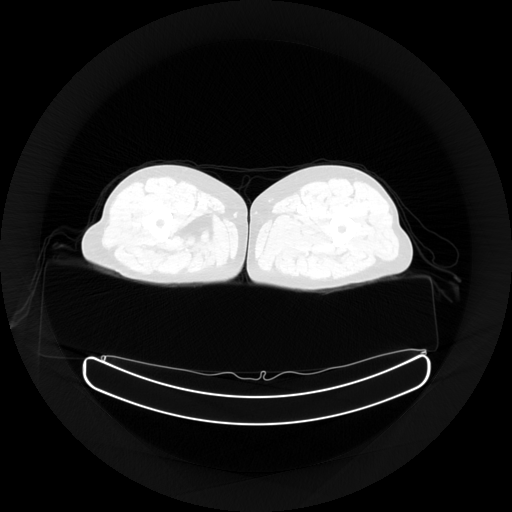
[im 32/255]
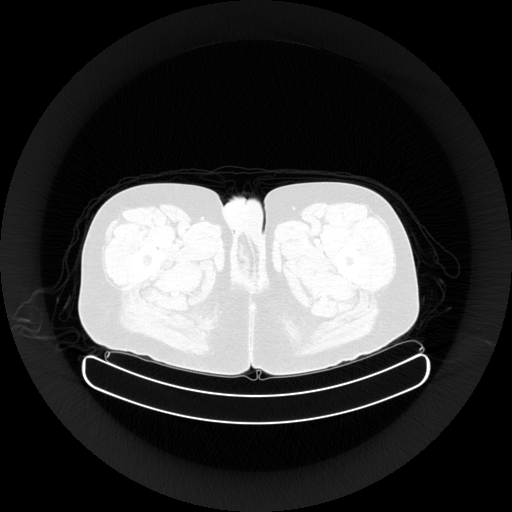
[im 64/255]
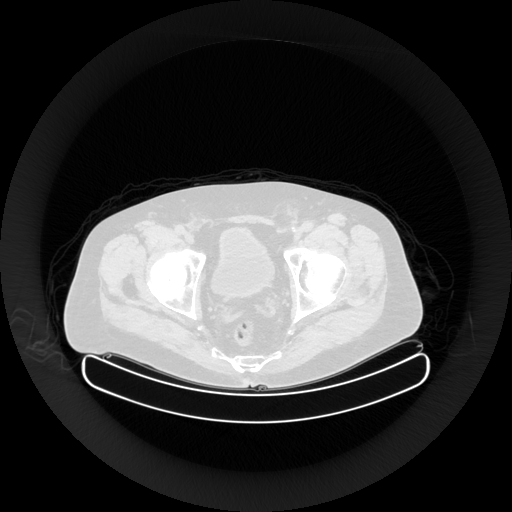
[im 96/255]
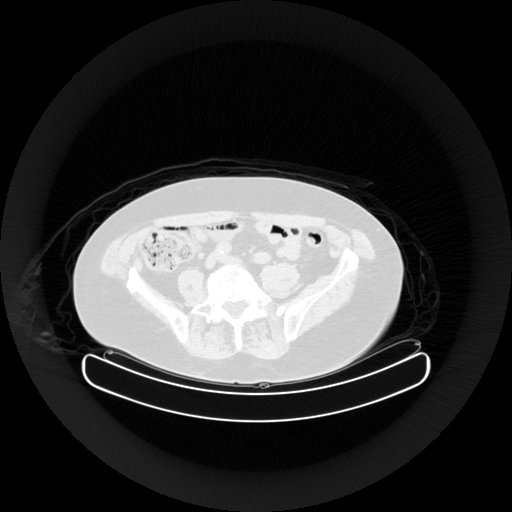
[im 128/255]
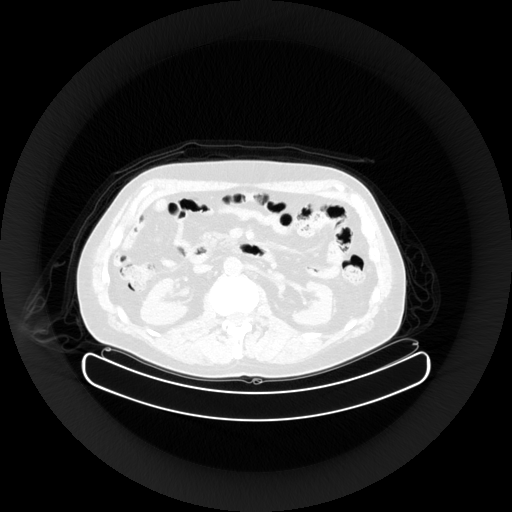
[im 159/255]
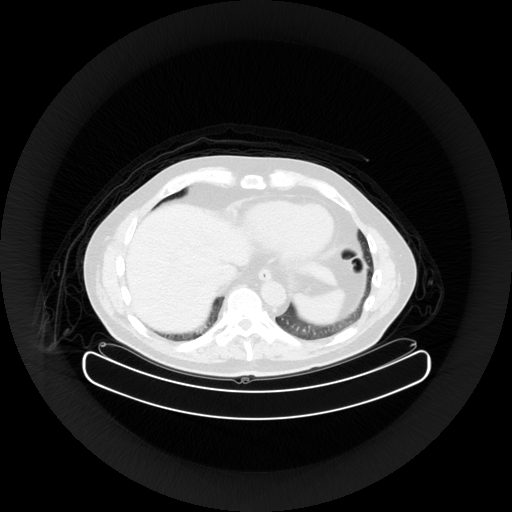
[im 191/255]
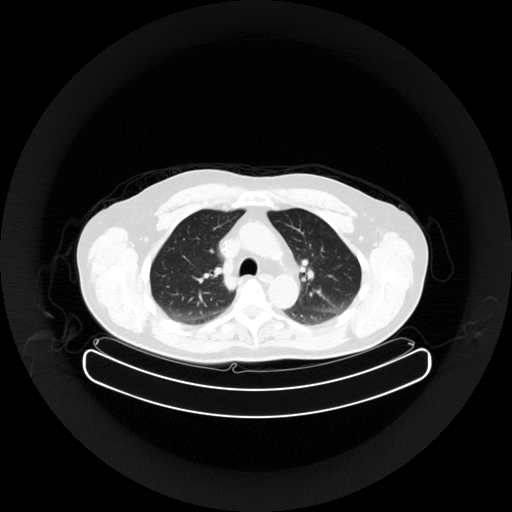
[im 223/255]
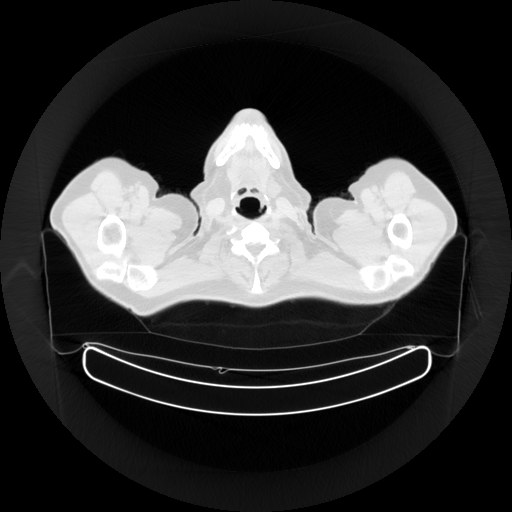
[im 255/255]
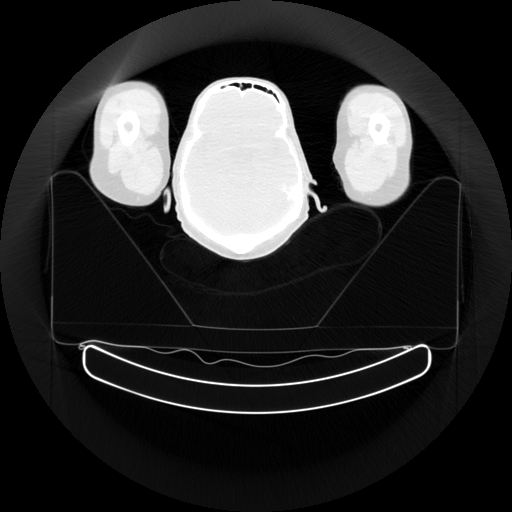

[Series 4226: (wb_nac) body · axial · 4.0mm · 4.00mm/px · z∈[-1231,-215]mm · 8 of 255 slices shown]
[im 1/255]
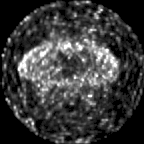
[im 37/255]
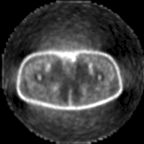
[im 73/255]
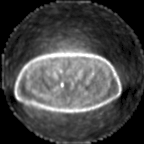
[im 109/255]
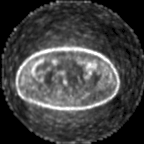
[im 146/255]
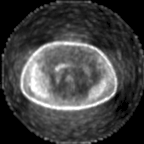
[im 182/255]
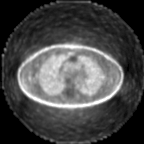
[im 218/255]
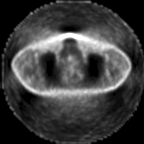
[im 255/255]
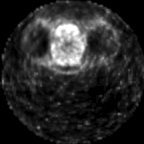

[Series 4228: (wb_ctac) body · axial · 4.0mm · 4.00mm/px · z∈[-1231,-215]mm · 8 of 255 slices shown]
[im 1/255]
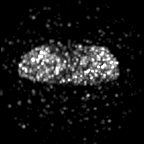
[im 37/255]
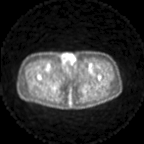
[im 73/255]
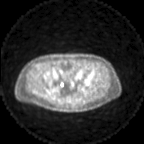
[im 109/255]
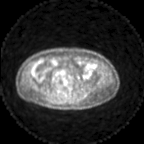
[im 146/255]
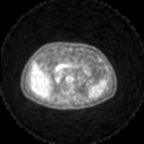
[im 182/255]
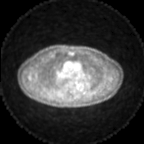
[im 218/255]
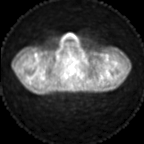
[im 255/255]
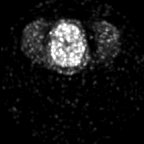

[25 of 25 positions shown; findings below may reference images not displayed]

FINDINGS: No suspicious foci of FDG concentration are detected in the neck, chest, abdomen 
or pelvis. Specifically, there are no FDG avid lymph nodes or suspicious FDG 
avid skeletal lesions. The spleen is normal in size and does not exhibit 
abnormal FDG activity. The previously present diffuse marrow activity has 
resolved. There is a small focus of degenerative activity within the L3-4 disc 
space. The PET images are otherwise unremarkable. 
A RIGHT chest Mediport is incidentally noted. There are no enlarged lymph nodes 
in the neck, chest, abdomen or pelvis visible on the low-dose CT. Minimal 
coronary artery calcifications are present. Small calcified gallstones are in 
the dependent portion of the otherwise unremarkable gallbladder. A small LEFT 
renal cyst is incidentally noted. The prostate is mildly enlarged. There appears 
to be minimal fat-containing LEFT inguinal hernia. The small lytic areas in the 
superior RIGHT iliac bone are stable and do not exhibit FDG avidity. Otherwise 
unremarkable.
IMPRESSION: 1. There are currently no foci of FDG avid lymphoma or other malignancy in the 
neck, chest, abdomen or pelvis. The spleen is normal in size and there are no 
visibly enlarged lymph nodes. 
2. The previously present diffuse marrow activity has resolved.

## 2021-12-31 IMAGING — PT PET CT SCAN TUMOR IMAGING SKULL TO THIGH
3 series · 25 of 25 positions shown · non-contrast
Comparison: PET scan July 2021

________________________________________________________________________________________________ 
PET CT SCAN TUMOR IMAGING SKULL TO THIGH, 12/31/2021 [DATE]: 
CLINICAL INDICATION: Diffuse large B-cell lymphoma diagnosed in 6866.
TECHNIQUE: A dose of 12.9 millicuries of 18-FDG was administered intravenously 
and skull to thigh PET scanning was performed at 62 minutes. Tomographic scans 
were reconstructed in axial, coronal, and sagittal projections. The data was 
reconstructed into a three-dimensional volume rendered image and reviewed in a 
rotational cine loop. Serum blood glucose at the time of injection was 89 mg/dl.

[Series 201: body-low dose ct, idose (4) · axial · 4.0mm · 1.17mm/px · z∈[-1142,-42]mm · 9 of 276 slices shown]
[im 1/276]
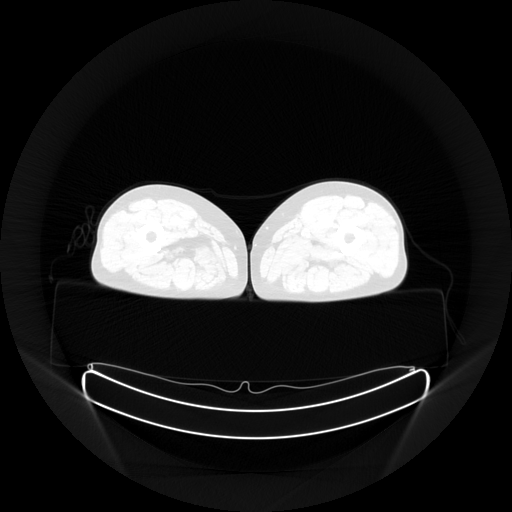
[im 35/276]
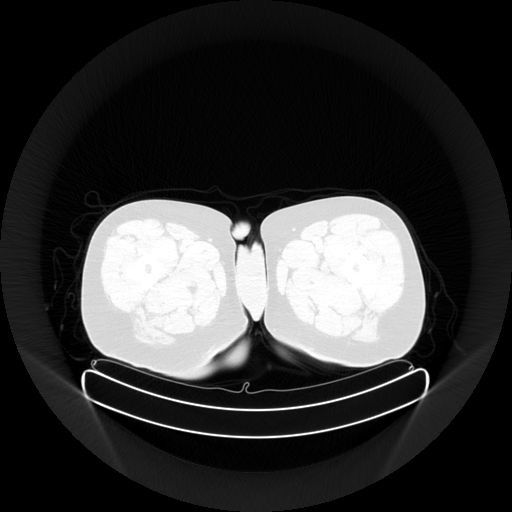
[im 69/276]
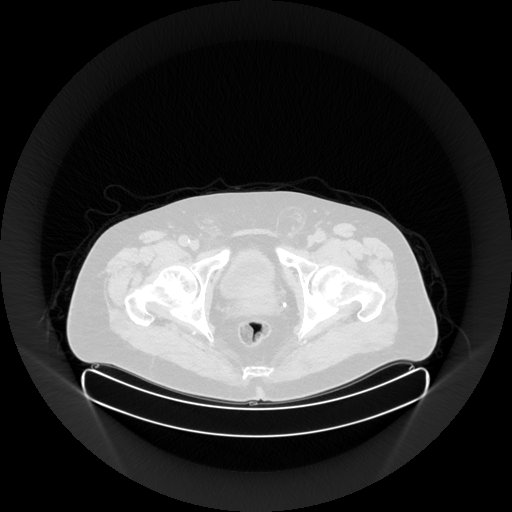
[im 104/276]
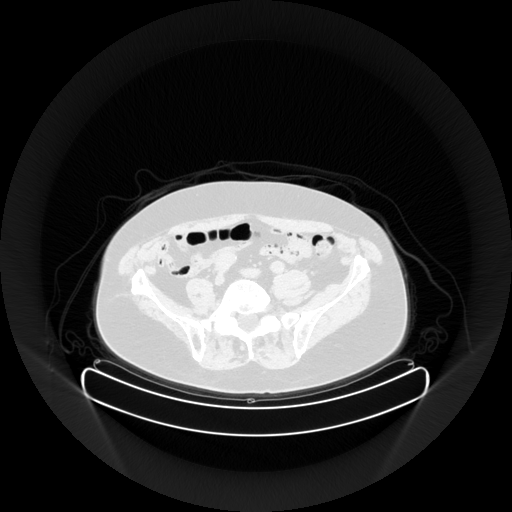
[im 138/276]
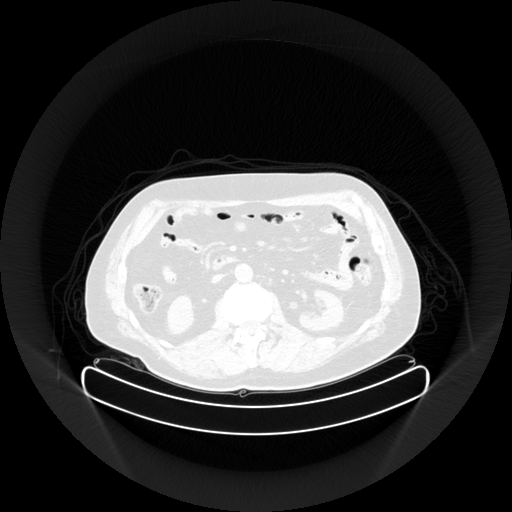
[im 172/276]
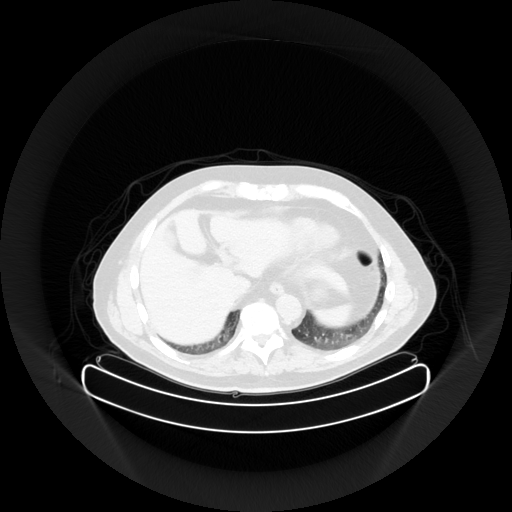
[im 207/276]
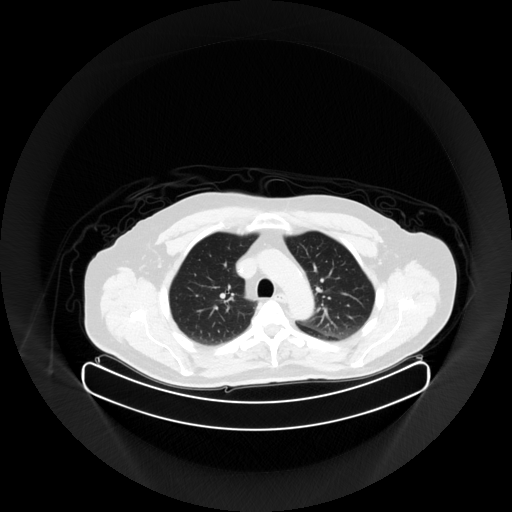
[im 241/276]
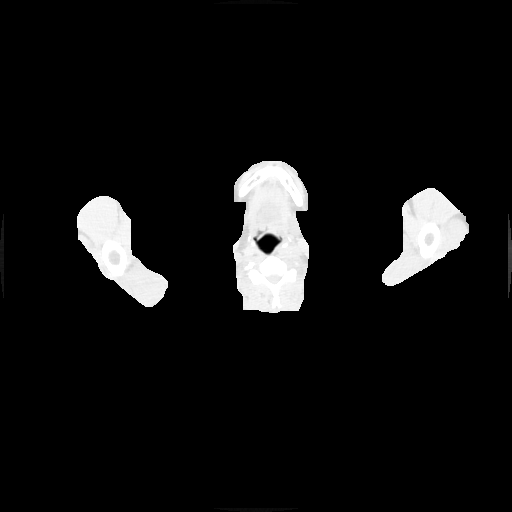
[im 276/276  brain]
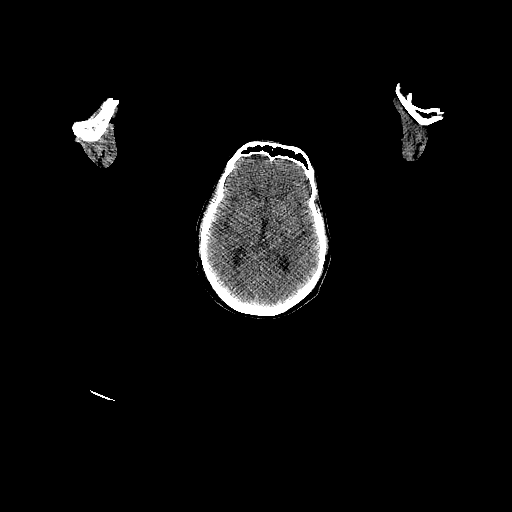

[Series 6005: (wb_nac) body · axial · 4.0mm · 4.00mm/px · z∈[-1142,-42]mm · 8 of 276 slices shown]
[im 1/276]
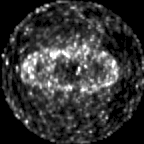
[im 40/276]
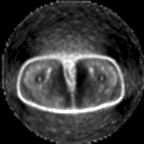
[im 79/276]
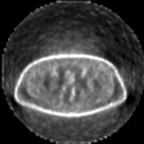
[im 118/276]
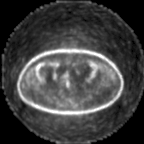
[im 158/276]
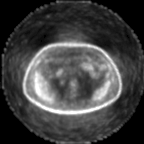
[im 197/276]
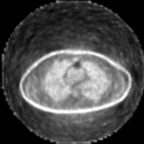
[im 236/276]
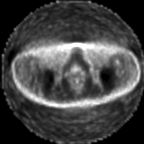
[im 276/276]
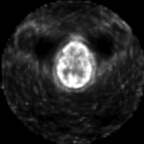

[Series 6006: (wb_ctac) body · axial · 4.0mm · 4.00mm/px · z∈[-1142,-42]mm · 8 of 276 slices shown]
[im 1/276]
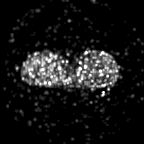
[im 40/276]
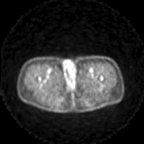
[im 79/276]
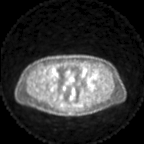
[im 118/276]
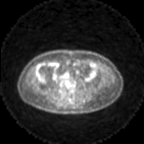
[im 158/276]
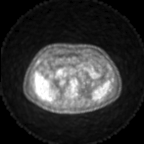
[im 197/276]
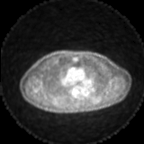
[im 236/276]
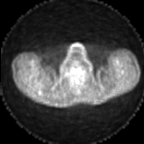
[im 276/276]
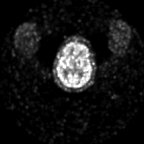

[25 of 25 positions shown; findings below may reference images not displayed]

FINDINGS: NECK/CHEST: No abnormal FDG uptake. 
ABDOMEN/PELVIS: No abnormal FDG uptake. 
MUSCULOSKELETAL: No abnormal FDG uptake. 
ADDITIONAL CT FUSION FINDINGS: Right-sided port. Degenerative changes. Mild 
atherosclerotic changes. Cholelithiasis. Renal cyst. Fat-containing left 
inguinal hernia.
IMPRESSION: Stable exam without findings suspicious for malignancy identified. No abnormal 
splenic activity. No new or developing adenopathy.

## 2022-12-31 IMAGING — PT PET CT SCAN TUMOR IMAGING SKULL TO THIGH
3 series · 25 of 25 positions shown · non-contrast
Comparison: PET scan from December 2021

________________________________________________________________________________________________ 
PET CT SCAN TUMOR IMAGING SKULL TO THIGH, 12/31/2022 [DATE]: 
CLINICAL INDICATION: Diffuse large B-cell lymphoma. Intra-abdominal lymph nodes. 
Treated with chemotherapy in 2722. 
Restaging
TECHNIQUE: A dose of 12.2 mCi of F-18 FDG was administered intravenously and 
skull to thigh PET scanning was performed at 65 minutes. Tomographic scans were 
reconstructed in axial, coronal, and sagittal projections. The data was 
reconstructed into a three-dimensional volume rendered image and reviewed in a 
rotational cine loop. Serum blood glucose at the time of injection was 78 mg/dl.

[Series 201: body-low dose ct, idose (4) · axial · 4.0mm · 1.17mm/px · z∈[-1305,-205]mm · 9 of 276 slices shown]
[im 1/276]
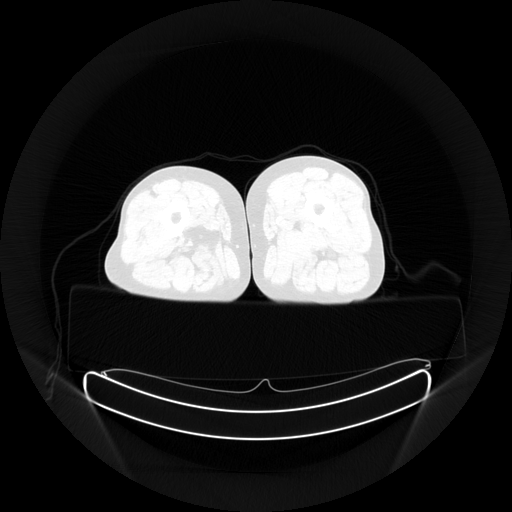
[im 35/276]
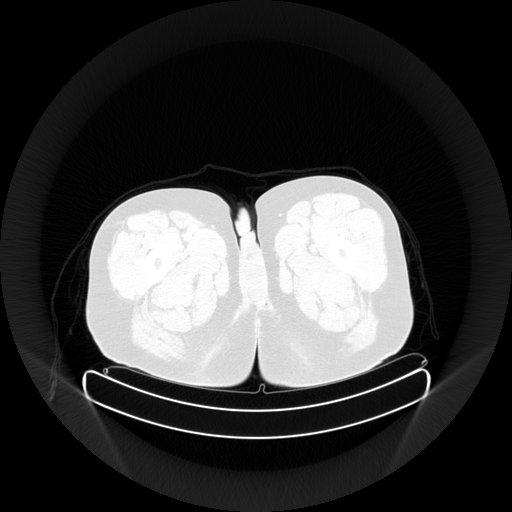
[im 69/276]
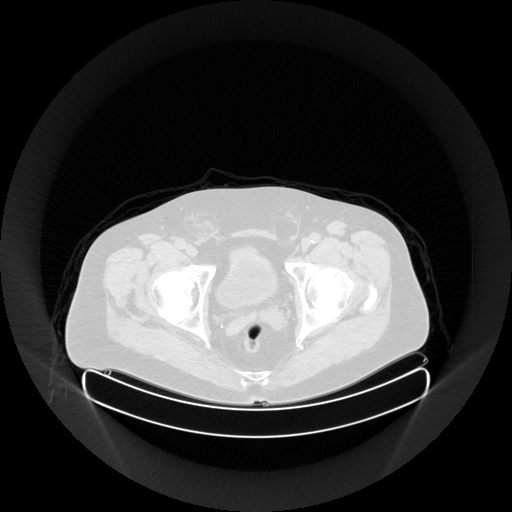
[im 104/276]
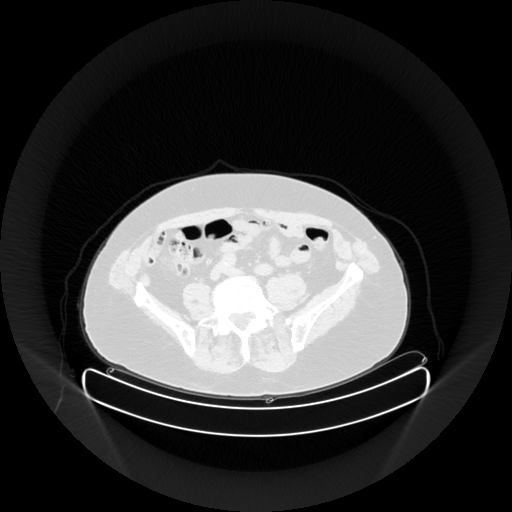
[im 138/276]
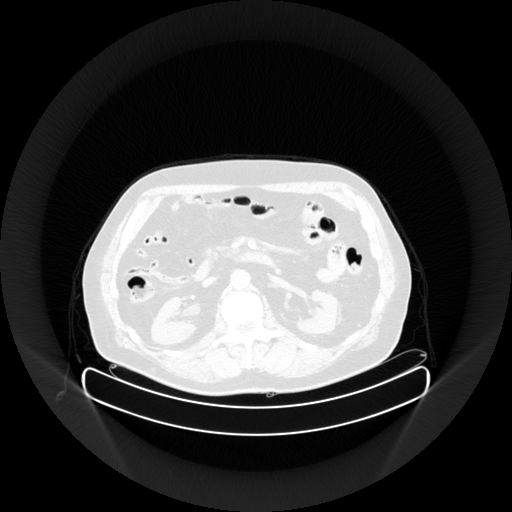
[im 172/276]
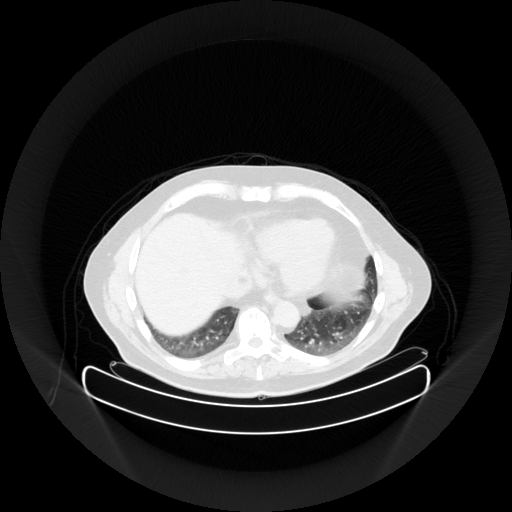
[im 207/276]
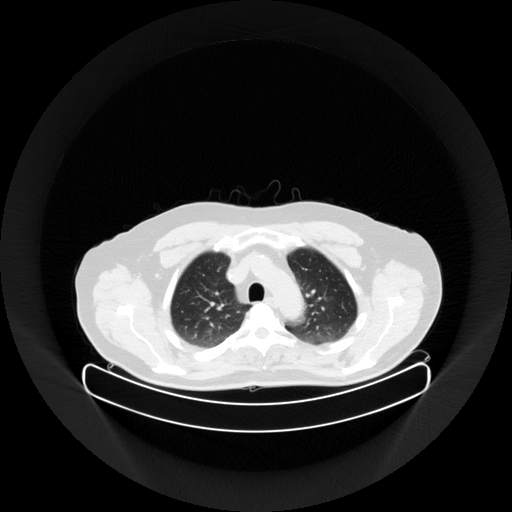
[im 241/276]
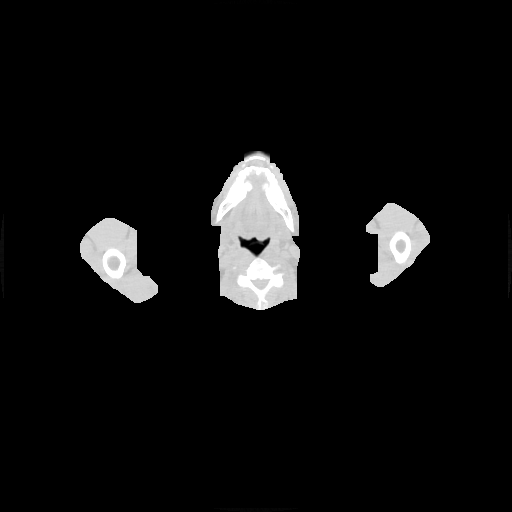
[im 276/276  brain]
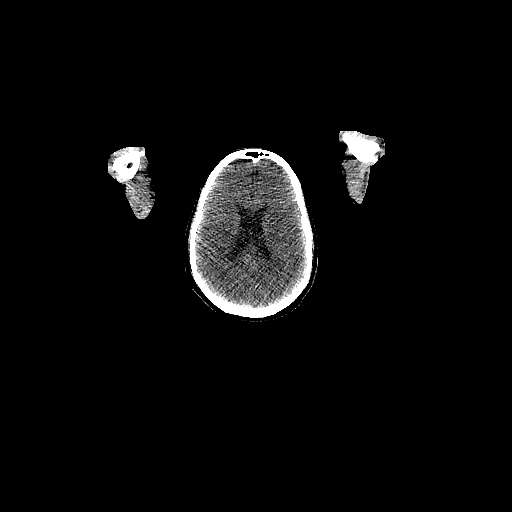

[Series 1662: (wb_nac) body · axial · 4.0mm · 4.00mm/px · z∈[-1305,-205]mm · 8 of 276 slices shown]
[im 1/276]
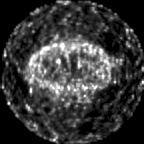
[im 40/276]
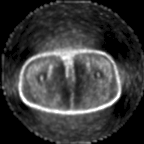
[im 79/276]
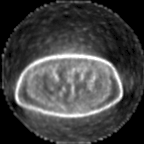
[im 118/276]
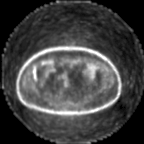
[im 158/276]
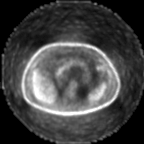
[im 197/276]
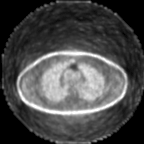
[im 236/276]
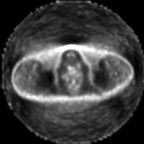
[im 276/276]
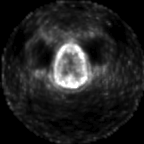

[Series 1664: (wb_ctac) body · axial · 4.0mm · 4.00mm/px · z∈[-1305,-205]mm · 8 of 276 slices shown]
[im 1/276]
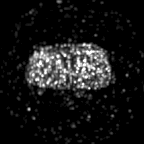
[im 40/276]
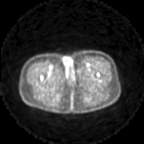
[im 79/276]
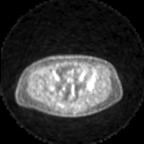
[im 118/276]
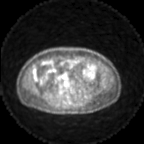
[im 158/276]
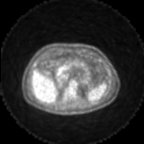
[im 197/276]
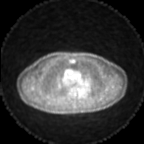
[im 236/276]
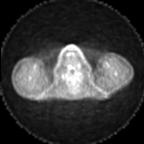
[im 276/276]
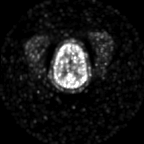

[25 of 25 positions shown; findings below may reference images not displayed]

FINDINGS: NECK/CHEST: No abnormal FDG uptake. 
ABDOMEN/PELVIS: No abnormal FDG uptake. 
MUSCULOSKELETAL: No abnormal FDG uptake. 
ADDITIONAL CT FUSION FINDINGS: There are chronic changes identified. There is a 
right-sided port. Atherosclerotic changes with at least mild coronary 
calcifications and degenerative changes. Cholelithiasis. No lymphadenopathy 
identified. Spleen is not enlarged. Small fat-containing left inguinal hernia. 
Right testicle is within the distal right inguinal canal. This is new since the 
prior exam and may be incidental.
IMPRESSION: No findings suspicious for malignancy. Note is made of the right testicle within 
the distal right inguinal canal. Otherwise no acute abnormality seen. Chronic 
changes as above.

## 2023-07-25 IMAGING — PT PET CT SCAN TUMOR IMAGING SKULL TO THIGH
1 of 3 series · 1 of 25 positions shown · non-contrast
Comparison: PET scan from December 2022

________________________________________________________________________________________________ 
PET CT SCAN TUMOR IMAGING SKULL TO THIGH, 07/25/2023 [DATE]: 
CLINICAL INDICATION: Diffuse Large B-cell Lymphoma, Intra-abdominal Lymph Nodes 
, last treatment in 8888. 
Restaging
TECHNIQUE: A dose of 14.0 mCi of F-18 FDG was administered intravenously and 
skull to thigh PET scanning was performed at 60 minutes. Tomographic scans were 
reconstructed in axial, coronal, and sagittal projections. The data was 
reconstructed into a three-dimensional volume rendered image and reviewed in a 
rotational cine loop. Serum blood glucose at the time of injection was 77 mg/dl.

[Series 4448: (wb_nac) body · axial · 4.0mm · 4.00mm/px · 1 of 251 slices shown]
[im 143/251]
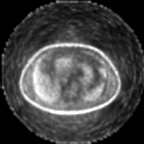

[1 of 25 positions shown; findings below may reference images not displayed]

FINDINGS: NECK/CHEST: No abnormal FDG uptake. 
ABDOMEN/PELVIS: No abnormal FDG uptake. 
MUSCULOSKELETAL: No abnormal FDG uptake. 
ADDITIONAL CT FUSION FINDINGS: There is a right-sided port identified. There are 
atherosclerotic changes with mild coronary calcification. There are degenerative 
changes. Cholelithiasis. Small fat-containing left inguinal hernia. Prostate 
gland is mildly enlarged though is not FDG avid.
IMPRESSION: Chronic changes as above. No PET evidence to suggest recurrent malignancy. 
Resolution of the right testicle within the right inguinal canal seen on the 
prior exam.
